# Patient Record
Sex: Female | Born: 1940 | Race: White | Hispanic: No | Smoking: Former smoker
Health system: Southern US, Community
[De-identification: ages and names within clinical notes are randomized; demographics above are authoritative.]

## PROBLEM LIST (undated history)

## (undated) DIAGNOSIS — R112 Nausea with vomiting, unspecified: Secondary | ICD-10-CM

## (undated) DIAGNOSIS — R0602 Shortness of breath: Secondary | ICD-10-CM

## (undated) DIAGNOSIS — I10 Essential (primary) hypertension: Secondary | ICD-10-CM

## (undated) DIAGNOSIS — G43909 Migraine, unspecified, not intractable, without status migrainosus: Secondary | ICD-10-CM

## (undated) DIAGNOSIS — M1990 Unspecified osteoarthritis, unspecified site: Secondary | ICD-10-CM

## (undated) DIAGNOSIS — Z9889 Other specified postprocedural states: Secondary | ICD-10-CM

## (undated) HISTORY — PX: DILATION AND CURETTAGE OF UTERUS: SHX78

## (undated) HISTORY — PX: JOINT REPLACEMENT: SHX530

---

## 2013-07-12 ENCOUNTER — Encounter (HOSPITAL_COMMUNITY): Payer: Self-pay | Admitting: Pharmacy Technician

## 2013-07-13 NOTE — Pre-Procedure Instructions (Signed)
Amelia JoDonna Bresnan  07/13/2013   Your procedure is scheduled on:  Tuesday, March 3rd  Report to Admitting at 0800 AM.  Call this number if you have problems the morning of surgery: [phone]   Remember:   Do not eat food or drink liquids after midnight.   Take these medicines the morning of surgery with A SIP OF WATER: none   Do not wear jewelry, make-up or nail polish.  Do not wear lotions, powders, or perfume, deodorant.  Do not shave 48 hours prior to surgery. Men may shave face and neck.  Do not bring valuables to the hospital.  [facility] is not responsible for any belongings or valuables.               Contacts, dentures or bridgework may not be worn into surgery.  Leave suitcase in the car. After surgery it may be brought to your room.  For patients admitted to the hospital, discharge time is determined by your    treatment team.          Please read over the following fact sheets that you were given: Pain Booklet, Coughing and Deep Breathing, Blood Transfusion Information, MRSA Information and Surgical Site Infection Prevention  [place] - Preparing for Surgery  Before surgery, you can play an important role.  Because skin is not sterile, your skin needs to be as free of germs as possible.  You can reduce the number of germs on you skin by washing with CHG (chlorahexidine gluconate) soap before surgery.  CHG is an antiseptic cleaner which kills germs and bonds with the skin to continue killing germs even after washing.  Please DO NOT use if you have an allergy to CHG or antibacterial soaps.  If your skin becomes reddened/irritated stop using the CHG and inform your nurse when you arrive at Short Stay.  Do not shave (including legs and underarms) for at least 48 hours prior to the first CHG shower.  You may shave your face.  Please follow these instructions carefully:   1.  Shower with CHG Soap the night before surgery and the morning of Surgery.  2.  If you choose to wash  your hair, wash your hair first as usual with your normal shampoo.  3.  After you shampoo, rinse your hair and body thoroughly to remove the shampoo.  4.  Use CHG as you would any other liquid soap.  You can apply CHG directly to the skin and wash gently with scrungie or a clean washcloth.  5.  Apply the CHG Soap to your body ONLY FROM THE NECK DOWN.  Do not use on open wounds or open sores.  Avoid contact with your eyes, ears, mouth and genitals (private parts).  Wash genitals (private parts) with your normal soap.  6.  Wash thoroughly, paying special attention to the area where your surgery will be performed.  7.  Thoroughly rinse your body with warm water from the neck down.  8.  DO NOT shower/wash with your normal soap after using and rinsing off the CHG Soap.  9.  Pat yourself dry with a clean towel.            10.  Wear clean pajamas.            11.  Place clean sheets on your bed the night of your first shower and do not sleep with pets.  Day of Surgery  Do not apply any lotions/deodorants the morning of surgery.  Please wear clean clothes to the hospital/surgery center.

## 2013-07-14 ENCOUNTER — Encounter (HOSPITAL_COMMUNITY)
Admission: RE | Admit: 2013-07-14 | Discharge: 2013-07-14 | Disposition: A | Discharge status: Home / Self Care | Payer: Medicare Other | Source: Ambulatory Visit | Attending: Anesthesiology | Admitting: Anesthesiology

## 2013-07-14 ENCOUNTER — Encounter (HOSPITAL_COMMUNITY)
Admission: RE | Admit: 2013-07-14 | Discharge: 2013-07-14 | Disposition: A | Discharge status: Home / Self Care | Payer: Medicare Other | Source: Ambulatory Visit | Attending: Orthopedic Surgery | Admitting: Orthopedic Surgery

## 2013-07-14 ENCOUNTER — Encounter (HOSPITAL_COMMUNITY): Payer: Self-pay

## 2013-07-14 DIAGNOSIS — Z01818 Encounter for other preprocedural examination: Secondary | ICD-10-CM | POA: Insufficient documentation

## 2013-07-14 DIAGNOSIS — Z01810 Encounter for preprocedural cardiovascular examination: Secondary | ICD-10-CM | POA: Insufficient documentation

## 2013-07-14 DIAGNOSIS — Z01812 Encounter for preprocedural laboratory examination: Secondary | ICD-10-CM | POA: Insufficient documentation

## 2013-07-14 HISTORY — DX: Essential (primary) hypertension: I10

## 2013-07-14 HISTORY — DX: Unspecified osteoarthritis, unspecified site: M19.90

## 2013-07-14 LAB — CBC
HEMATOCRIT: 40.9 % (ref 36.0–46.0)
Hemoglobin: 13.6 g/dL (ref 12.0–15.0)
MCH: 30.2 pg (ref 26.0–34.0)
MCHC: 33.3 g/dL (ref 30.0–36.0)
MCV: 90.7 fL (ref 78.0–100.0)
Platelets: 359 10*3/uL (ref 150–400)
RBC: 4.51 MIL/uL (ref 3.87–5.11)
RDW: 13 % (ref 11.5–15.5)
WBC: 7.9 10*3/uL (ref 4.0–10.5)

## 2013-07-14 LAB — TYPE AND SCREEN
ABO/RH(D): A POS
Antibody Screen: NEGATIVE

## 2013-07-14 LAB — URINE MICROSCOPIC-ADD ON

## 2013-07-14 LAB — SURGICAL PCR SCREEN
MRSA, PCR: NEGATIVE
STAPHYLOCOCCUS AUREUS: POSITIVE — AB

## 2013-07-14 LAB — URINALYSIS, ROUTINE W REFLEX MICROSCOPIC
Bilirubin Urine: NEGATIVE
Glucose, UA: NEGATIVE mg/dL
Ketones, ur: 15 mg/dL — AB
Nitrite: NEGATIVE
PROTEIN: NEGATIVE mg/dL
SPECIFIC GRAVITY, URINE: 1.03 (ref 1.005–1.030)
Urobilinogen, UA: 0.2 mg/dL (ref 0.0–1.0)
pH: 5.5 (ref 5.0–8.0)

## 2013-07-14 LAB — PROTIME-INR
INR: 1.04 (ref 0.00–1.49)
Prothrombin Time: 13.4 seconds (ref 11.6–15.2)

## 2013-07-14 LAB — BASIC METABOLIC PANEL
BUN: 19 mg/dL (ref 6–23)
CALCIUM: 9.7 mg/dL (ref 8.4–10.5)
CHLORIDE: 99 meq/L (ref 96–112)
CO2: 28 mEq/L (ref 19–32)
Creatinine, Ser: 0.89 mg/dL (ref 0.50–1.10)
GFR calc Af Amer: 74 mL/min — ABNORMAL LOW (ref >90)
GFR calc non Af Amer: 64 mL/min — ABNORMAL LOW (ref >90)
Glucose, Bld: 107 mg/dL — ABNORMAL HIGH (ref 70–99)
Potassium: 3.9 mEq/L (ref 3.7–5.3)
Sodium: 140 mEq/L (ref 137–147)

## 2013-07-14 LAB — ABO/RH: ABO/RH(D): A POS

## 2013-07-14 NOTE — Progress Notes (Signed)
Prescription called into [facility] in [place] for mupirocin 2% ointment.

## 2013-07-14 NOTE — Progress Notes (Signed)
Primary physician - dr. Clelia CroftShaw ([place]) Does not have a cardiologist No prior cardiac testing

## 2013-07-16 LAB — URINE CULTURE

## 2013-07-24 MED ORDER — ACETAMINOPHEN 500 MG PO TABS
1000.0000 mg | ORAL_TABLET | Freq: Once | ORAL | Status: AC
  Administered 2013-07-25: 1000 mg via ORAL
  Filled 2013-07-24: qty 2

## 2013-07-24 MED ORDER — CEFAZOLIN SODIUM-DEXTROSE 2-3 GM-% IV SOLR
2.0000 g | INTRAVENOUS | Status: AC
  Administered 2013-07-25: 2 g via INTRAVENOUS
  Filled 2013-07-24: qty 50

## 2013-07-25 ENCOUNTER — Encounter (HOSPITAL_COMMUNITY): Payer: Medicare Other | Admitting: Anesthesiology

## 2013-07-25 ENCOUNTER — Inpatient Hospital Stay (HOSPITAL_COMMUNITY): Payer: Medicare Other | Admitting: Anesthesiology

## 2013-07-25 ENCOUNTER — Encounter (HOSPITAL_COMMUNITY): Payer: Self-pay | Admitting: *Deleted

## 2013-07-25 ENCOUNTER — Inpatient Hospital Stay (HOSPITAL_COMMUNITY)
Admission: RE | Admit: 2013-07-25 | Discharge: 2013-07-28 | DRG: 470 | Disposition: A | Discharge status: Home Health | Payer: Medicare Other | Source: Ambulatory Visit | Attending: Orthopedic Surgery | Admitting: Orthopedic Surgery

## 2013-07-25 ENCOUNTER — Encounter (HOSPITAL_COMMUNITY)
Admission: RE | Disposition: A | Discharge status: Home Health | Payer: Self-pay | Source: Ambulatory Visit | Attending: Orthopedic Surgery

## 2013-07-25 DIAGNOSIS — I10 Essential (primary) hypertension: Secondary | ICD-10-CM | POA: Diagnosis present

## 2013-07-25 DIAGNOSIS — M1710 Unilateral primary osteoarthritis, unspecified knee: Principal | ICD-10-CM | POA: Diagnosis present

## 2013-07-25 HISTORY — PX: TOTAL KNEE ARTHROPLASTY: SHX125

## 2013-07-25 HISTORY — DX: Migraine, unspecified, not intractable, without status migrainosus: G43.909

## 2013-07-25 SURGERY — ARTHROPLASTY, KNEE, TOTAL
Anesthesia: Regional | Site: Knee | Laterality: Right

## 2013-07-25 MED ORDER — HYDROMORPHONE HCL PF 1 MG/ML IJ SOLN
INTRAMUSCULAR | Status: AC
  Filled 2013-07-25: qty 1

## 2013-07-25 MED ORDER — DEXTROSE-NACL 5-0.45 % IV SOLN: 100.0000 mL/h | INTRAVENOUS | Status: DC

## 2013-07-25 MED ORDER — MORPHINE SULFATE 2 MG/ML IJ SOLN: 2.0000 mg | INTRAMUSCULAR | Status: DC | PRN

## 2013-07-25 MED ORDER — BUPIVACAINE LIPOSOME 1.3 % IJ SUSP
INTRAMUSCULAR | Status: DC | PRN
  Administered 2013-07-25: 20 mL

## 2013-07-25 MED ORDER — HYDROMORPHONE BOLUS VIA INFUSION
0.5000 mg | Freq: Once | INTRAVENOUS | Status: AC
  Administered 2013-07-25 (×2): 0.5 mg via INTRAVENOUS
  Filled 2013-07-25: qty 1

## 2013-07-25 MED ORDER — 0.9 % SODIUM CHLORIDE (POUR BTL) OPTIME
TOPICAL | Status: DC | PRN
  Administered 2013-07-25: 1000 mL

## 2013-07-25 MED ORDER — ARTIFICIAL TEARS OP OINT
TOPICAL_OINTMENT | OPHTHALMIC | Status: DC | PRN
  Administered 2013-07-25: 1 via OPHTHALMIC

## 2013-07-25 MED ORDER — DEXAMETHASONE 6 MG PO TABS
10.0000 mg | ORAL_TABLET | Freq: Three times a day (TID) | ORAL | Status: AC
  Administered 2013-07-25 – 2013-07-26 (×2): 10 mg via ORAL
  Filled 2013-07-25 (×3): qty 1

## 2013-07-25 MED ORDER — LACTATED RINGERS IV SOLN
INTRAVENOUS | Status: DC | PRN
  Administered 2013-07-25 (×2): via INTRAVENOUS

## 2013-07-25 MED ORDER — ACETAMINOPHEN 650 MG RE SUPP: 650.0000 mg | Freq: Four times a day (QID) | RECTAL | Status: DC | PRN

## 2013-07-25 MED ORDER — PHENOL 1.4 % MT LIQD: 1.0000 | OROMUCOSAL | Status: DC | PRN

## 2013-07-25 MED ORDER — ONDANSETRON HCL 4 MG/2ML IJ SOLN
INTRAMUSCULAR | Status: DC | PRN
  Administered 2013-07-25: 4 mg via INTRAVENOUS

## 2013-07-25 MED ORDER — CHLORHEXIDINE GLUCONATE CLOTH 2 % EX PADS
6.0000 | MEDICATED_PAD | Freq: Once | CUTANEOUS | Status: DC
Start: 2013-07-25 — End: 2013-07-25

## 2013-07-25 MED ORDER — LISINOPRIL 10 MG PO TABS
10.0000 mg | ORAL_TABLET | Freq: Every day | ORAL | Status: DC
  Administered 2013-07-25 – 2013-07-28 (×4): 10 mg via ORAL
  Filled 2013-07-25 (×4): qty 1

## 2013-07-25 MED ORDER — SODIUM CHLORIDE 0.9 % IR SOLN
Status: DC | PRN
  Administered 2013-07-25: 3000 mL

## 2013-07-25 MED ORDER — ACETAMINOPHEN 325 MG PO TABS
650.0000 mg | ORAL_TABLET | Freq: Four times a day (QID) | ORAL | Status: DC | PRN
  Administered 2013-07-25 – 2013-07-28 (×3): 650 mg via ORAL
  Filled 2013-07-25 (×3): qty 2

## 2013-07-25 MED ORDER — MIDAZOLAM HCL 2 MG/2ML IJ SOLN
INTRAMUSCULAR | Status: AC
  Administered 2013-07-25: 2 mg
  Filled 2013-07-25: qty 2

## 2013-07-25 MED ORDER — LIDOCAINE HCL (CARDIAC) 20 MG/ML IV SOLN
INTRAVENOUS | Status: AC
  Filled 2013-07-25: qty 5

## 2013-07-25 MED ORDER — OXYCODONE HCL 5 MG PO TABS
5.0000 mg | ORAL_TABLET | ORAL | Status: DC | PRN
  Administered 2013-07-26 (×2): 5 mg via ORAL
  Administered 2013-07-27 (×2): 10 mg via ORAL
  Administered 2013-07-27 (×2): 5 mg via ORAL
  Administered 2013-07-28 (×2): 10 mg via ORAL
  Filled 2013-07-25: qty 1
  Filled 2013-07-25: qty 2
  Filled 2013-07-25 (×2): qty 1
  Filled 2013-07-25: qty 2
  Filled 2013-07-25: qty 1
  Filled 2013-07-25 (×2): qty 2

## 2013-07-25 MED ORDER — FENTANYL CITRATE 0.05 MG/ML IJ SOLN
INTRAMUSCULAR | Status: AC
  Filled 2013-07-25: qty 5

## 2013-07-25 MED ORDER — MIDAZOLAM HCL 2 MG/2ML IJ SOLN
INTRAMUSCULAR | Status: AC
  Filled 2013-07-25: qty 2

## 2013-07-25 MED ORDER — SODIUM CHLORIDE 0.9 % IJ SOLN
INTRAMUSCULAR | Status: DC | PRN
  Administered 2013-07-25: 40 mL

## 2013-07-25 MED ORDER — GLYCOPYRROLATE 0.2 MG/ML IJ SOLN
INTRAMUSCULAR | Status: DC | PRN
  Administered 2013-07-25: 0.4 mg via INTRAVENOUS

## 2013-07-25 MED ORDER — DEXAMETHASONE SODIUM PHOSPHATE 10 MG/ML IJ SOLN
10.0000 mg | Freq: Three times a day (TID) | INTRAMUSCULAR | Status: AC
  Administered 2013-07-25: 10 mg via INTRAVENOUS
  Filled 2013-07-25 (×4): qty 1

## 2013-07-25 MED ORDER — ROCURONIUM BROMIDE 50 MG/5ML IV SOLN
INTRAVENOUS | Status: AC
  Filled 2013-07-25: qty 1

## 2013-07-25 MED ORDER — ONDANSETRON HCL 4 MG/2ML IJ SOLN: 4.0000 mg | Freq: Four times a day (QID) | INTRAMUSCULAR | Status: DC | PRN

## 2013-07-25 MED ORDER — OXYCODONE HCL 5 MG/5ML PO SOLN: 5.0000 mg | Freq: Once | ORAL | Status: DC | PRN

## 2013-07-25 MED ORDER — DEXAMETHASONE SODIUM PHOSPHATE 10 MG/ML IJ SOLN
INTRAMUSCULAR | Status: AC
  Filled 2013-07-25: qty 1

## 2013-07-25 MED ORDER — NEOSTIGMINE METHYLSULFATE 1 MG/ML IJ SOLN
INTRAMUSCULAR | Status: DC | PRN
  Administered 2013-07-25: 3 mg via INTRAVENOUS

## 2013-07-25 MED ORDER — LIDOCAINE HCL (CARDIAC) 20 MG/ML IV SOLN
INTRAVENOUS | Status: DC | PRN
  Administered 2013-07-25: 40 mg via INTRAVENOUS

## 2013-07-25 MED ORDER — PROPOFOL 10 MG/ML IV BOLUS
INTRAVENOUS | Status: DC | PRN
  Administered 2013-07-25: 150 mg via INTRAVENOUS

## 2013-07-25 MED ORDER — ROCURONIUM BROMIDE 100 MG/10ML IV SOLN
INTRAVENOUS | Status: DC | PRN
  Administered 2013-07-25: 40 mg via INTRAVENOUS

## 2013-07-25 MED ORDER — CHLORHEXIDINE GLUCONATE CLOTH 2 % EX PADS: 6.0000 | MEDICATED_PAD | Freq: Once | CUTANEOUS | Status: DC

## 2013-07-25 MED ORDER — PROPOFOL 10 MG/ML IV BOLUS
INTRAVENOUS | Status: AC
  Filled 2013-07-25: qty 20

## 2013-07-25 MED ORDER — LACTATED RINGERS IV SOLN
INTRAVENOUS | Status: DC
  Administered 2013-07-25: 09:00:00 via INTRAVENOUS

## 2013-07-25 MED ORDER — OXYCODONE HCL 5 MG PO TABS: 5.0000 mg | ORAL_TABLET | Freq: Once | ORAL | Status: DC | PRN

## 2013-07-25 MED ORDER — CEFAZOLIN SODIUM-DEXTROSE 2-3 GM-% IV SOLR
2.0000 g | Freq: Four times a day (QID) | INTRAVENOUS | Status: AC
  Administered 2013-07-25 (×2): 2 g via INTRAVENOUS
  Filled 2013-07-25 (×2): qty 50

## 2013-07-25 MED ORDER — METOCLOPRAMIDE HCL 10 MG PO TABS: 5.0000 mg | ORAL_TABLET | Freq: Three times a day (TID) | ORAL | Status: DC | PRN

## 2013-07-25 MED ORDER — FENTANYL CITRATE 0.05 MG/ML IJ SOLN
INTRAMUSCULAR | Status: AC
  Administered 2013-07-25: 100 ug
  Filled 2013-07-25: qty 2

## 2013-07-25 MED ORDER — ONDANSETRON HCL 4 MG PO TABS: 4.0000 mg | ORAL_TABLET | Freq: Four times a day (QID) | ORAL | Status: DC | PRN

## 2013-07-25 MED ORDER — CHLORHEXIDINE GLUCONATE 4 % EX LIQD
60.0000 mL | Freq: Once | CUTANEOUS | Status: DC
Start: 2013-07-25 — End: 2013-07-25
  Filled 2013-07-25: qty 60

## 2013-07-25 MED ORDER — BUPIVACAINE LIPOSOME 1.3 % IJ SUSP
20.0000 mL | INTRAMUSCULAR | Status: DC
  Filled 2013-07-25: qty 20

## 2013-07-25 MED ORDER — ASPIRIN EC 325 MG PO TBEC
325.0000 mg | DELAYED_RELEASE_TABLET | Freq: Every day | ORAL | Status: DC
  Administered 2013-07-26 – 2013-07-28 (×3): 325 mg via ORAL
  Filled 2013-07-25 (×4): qty 1

## 2013-07-25 MED ORDER — METOCLOPRAMIDE HCL 5 MG/ML IJ SOLN: 5.0000 mg | Freq: Three times a day (TID) | INTRAMUSCULAR | Status: DC | PRN

## 2013-07-25 MED ORDER — DEXTROSE-NACL 5-0.45 % IV SOLN: INTRAVENOUS | Status: AC

## 2013-07-25 MED ORDER — MENTHOL 3 MG MT LOZG
1.0000 | LOZENGE | OROMUCOSAL | Status: DC | PRN
  Filled 2013-07-25: qty 9

## 2013-07-25 MED ORDER — DOCUSATE SODIUM 100 MG PO CAPS
100.0000 mg | ORAL_CAPSULE | Freq: Two times a day (BID) | ORAL | Status: DC
  Administered 2013-07-25 – 2013-07-28 (×5): 100 mg via ORAL
  Filled 2013-07-25 (×7): qty 1

## 2013-07-25 MED ORDER — FENTANYL CITRATE 0.05 MG/ML IJ SOLN
INTRAMUSCULAR | Status: DC | PRN
  Administered 2013-07-25 (×4): 50 ug via INTRAVENOUS
  Administered 2013-07-25: 100 ug via INTRAVENOUS

## 2013-07-25 MED ORDER — HYDROMORPHONE HCL PF 1 MG/ML IJ SOLN
0.2500 mg | INTRAMUSCULAR | Status: DC | PRN
Start: 2013-07-25 — End: 2013-07-25

## 2013-07-25 MED ORDER — ARTIFICIAL TEARS OP OINT
TOPICAL_OINTMENT | OPHTHALMIC | Status: AC
  Filled 2013-07-25: qty 3.5

## 2013-07-25 MED ORDER — ONDANSETRON HCL 4 MG/2ML IJ SOLN
INTRAMUSCULAR | Status: AC
  Filled 2013-07-25: qty 2

## 2013-07-25 MED ORDER — PHENYLEPHRINE HCL 10 MG/ML IJ SOLN
INTRAMUSCULAR | Status: DC | PRN
  Administered 2013-07-25: 120 ug via INTRAVENOUS

## 2013-07-25 SURGICAL SUPPLY — 65 items
BANDAGE ESMARK 6X9 LF (GAUZE/BANDAGES/DRESSINGS) ×1 IMPLANT
BLADE SAG 18X100X1.27 (BLADE) ×4 IMPLANT
BLADE SURG 10 STRL SS (BLADE) ×4 IMPLANT
BNDG ELASTIC 6X15 VLCR STRL LF (GAUZE/BANDAGES/DRESSINGS) ×2 IMPLANT
BNDG ESMARK 6X9 LF (GAUZE/BANDAGES/DRESSINGS) ×2
BOWL SMART MIX CTS (DISPOSABLE) ×2 IMPLANT
CEMENT BONE SIMPLEX SPEEDSET (Cement) ×4 IMPLANT
COVER SURGICAL LIGHT HANDLE (MISCELLANEOUS) ×2 IMPLANT
CUFF TOURNIQUET SINGLE 34IN LL (TOURNIQUET CUFF) ×2 IMPLANT
DRAPE EXTREMITY T 121X128X90 (DRAPE) ×2 IMPLANT
DRAPE PROXIMA HALF (DRAPES) ×2 IMPLANT
DRAPE U-SHAPE 47X51 STRL (DRAPES) ×2 IMPLANT
DRSG ADAPTIC 3X8 NADH LF (GAUZE/BANDAGES/DRESSINGS) ×2 IMPLANT
DURAPREP 26ML APPLICATOR (WOUND CARE) ×4 IMPLANT
ELECT CAUTERY BLADE 6.4 (BLADE) ×2 IMPLANT
ELECT REM PT RETURN 9FT ADLT (ELECTROSURGICAL) ×2
ELECTRODE REM PT RTRN 9FT ADLT (ELECTROSURGICAL) ×1 IMPLANT
EVACUATOR 1/8 PVC DRAIN (DRAIN) IMPLANT
FACESHIELD LNG OPTICON STERILE (SAFETY) ×4 IMPLANT
GLOVE BIO SURGEON STRL SZ7 (GLOVE) ×2 IMPLANT
GLOVE BIO SURGEON STRL SZ7.5 (GLOVE) ×2 IMPLANT
GLOVE BIOGEL PI IND STRL 6 (GLOVE) ×1 IMPLANT
GLOVE BIOGEL PI IND STRL 7.0 (GLOVE) ×2 IMPLANT
GLOVE BIOGEL PI IND STRL 8 (GLOVE) ×1 IMPLANT
GLOVE BIOGEL PI INDICATOR 6 (GLOVE) ×1
GLOVE BIOGEL PI INDICATOR 7.0 (GLOVE) ×2
GLOVE BIOGEL PI INDICATOR 8 (GLOVE) ×1
GOWN STRL REUS W/ TWL LRG LVL3 (GOWN DISPOSABLE) ×2 IMPLANT
GOWN STRL REUS W/ TWL XL LVL3 (GOWN DISPOSABLE) ×2 IMPLANT
GOWN STRL REUS W/TWL LRG LVL3 (GOWN DISPOSABLE) ×2
GOWN STRL REUS W/TWL XL LVL3 (GOWN DISPOSABLE) ×2
HANDPIECE INTERPULSE COAX TIP (DISPOSABLE) ×1
IMMOBILIZER KNEE 22 UNIV (SOFTGOODS) ×2 IMPLANT
IMMOBILIZER KNEE 24 THIGH 36 (MISCELLANEOUS) IMPLANT
IMMOBILIZER KNEE 24 UNIV (MISCELLANEOUS)
KIT BASIN OR (CUSTOM PROCEDURE TRAY) ×2 IMPLANT
KIT ROOM TURNOVER OR (KITS) ×2 IMPLANT
KNEE/VIT E POLY LINER LEVEL 1B ×2 IMPLANT
MANIFOLD NEPTUNE II (INSTRUMENTS) ×2 IMPLANT
NEEDLE 18GX1X1/2 (RX/OR ONLY) (NEEDLE) ×2 IMPLANT
NEEDLE HYPO 25GX1X1/2 BEV (NEEDLE) IMPLANT
NS IRRIG 1000ML POUR BTL (IV SOLUTION) ×2 IMPLANT
PACK TOTAL JOINT (CUSTOM PROCEDURE TRAY) ×2 IMPLANT
PAD ABD 8X10 STRL (GAUZE/BANDAGES/DRESSINGS) ×4 IMPLANT
PAD ARMBOARD 7.5X6 YLW CONV (MISCELLANEOUS) ×4 IMPLANT
PAD CAST 4YDX4 CTTN HI CHSV (CAST SUPPLIES) ×1 IMPLANT
PADDING CAST COTTON 4X4 STRL (CAST SUPPLIES) ×1
PADDING CAST COTTON 6X4 STRL (CAST SUPPLIES) ×2 IMPLANT
SET HNDPC FAN SPRY TIP SCT (DISPOSABLE) ×1 IMPLANT
SPONGE GAUZE 4X4 12PLY (GAUZE/BANDAGES/DRESSINGS) ×2 IMPLANT
STAPLER VISISTAT 35W (STAPLE) IMPLANT
STRIP CLOSURE SKIN 1/2X4 (GAUZE/BANDAGES/DRESSINGS) ×4 IMPLANT
SUCTION FRAZIER TIP 10 FR DISP (SUCTIONS) ×2 IMPLANT
SUT MNCRL AB 4-0 PS2 18 (SUTURE) ×2 IMPLANT
SUT MON AB 2-0 CT1 27 (SUTURE) ×4 IMPLANT
SUT MON AB 2-0 CT1 36 (SUTURE) ×2 IMPLANT
SUT VIC AB 0 CT1 27 (SUTURE) ×1
SUT VIC AB 0 CT1 27XBRD ANBCTR (SUTURE) ×1 IMPLANT
SUT VIC AB 1 CTX 36 (SUTURE) ×2
SUT VIC AB 1 CTX36XBRD ANBCTR (SUTURE) ×2 IMPLANT
SYR 50ML LL SCALE MARK (SYRINGE) ×2 IMPLANT
SYR CONTROL 10ML LL (SYRINGE) IMPLANT
TOWEL OR 17X24 6PK STRL BLUE (TOWEL DISPOSABLE) ×2 IMPLANT
TOWEL OR 17X26 10 PK STRL BLUE (TOWEL DISPOSABLE) ×2 IMPLANT
WATER STERILE IRR 1000ML POUR (IV SOLUTION) IMPLANT

## 2013-07-25 NOTE — Interval H&P Note (Signed)
History and Physical Interval Note:  07/25/2013 7:30 AM  [name]  has presented today for surgery, with the diagnosis of OA RIGHT KNEE  The various methods of treatment have been discussed with the patient and family. After consideration of risks, benefits and other options for treatment, the patient has consented to  Procedure(s): RIGHT TOTAL KNEE ARTHROPLASTY (Right) as a surgical intervention .  The patient's history has been reviewed, patient examined, no change in status, stable for surgery.  I have reviewed the patient's chart and labs.  Questions were answered to the patient's satisfaction.     Robin Petrakis, D

## 2013-07-25 NOTE — Progress Notes (Signed)
Utilization review completed.  

## 2013-07-25 NOTE — Plan of Care (Signed)
Problem: Consults Goal: Diagnosis- Total Joint Replacement Primary Total Knee Right     

## 2013-07-25 NOTE — Preoperative (Signed)
Beta Blockers   Reason not to administer Beta Blockers:Not Applicable 

## 2013-07-25 NOTE — Anesthesia Procedure Notes (Signed)
Procedure Name: Intubation Date/Time: 07/25/2013 11:07 AM Performed by: Tyrone NineSAUVE, Keng Jewel F Pre-anesthesia Checklist: Patient identified, Timeout performed, Emergency Drugs available, Suction available and Patient being monitored Patient Re-evaluated:Patient Re-evaluated prior to inductionOxygen Delivery Method: Circle system utilized Preoxygenation: Pre-oxygenation with 100% oxygen Intubation Type: IV induction Ventilation: Oral airway inserted - appropriate to patient size and Mask ventilation without difficulty Laryngoscope Size: Mac and 3 Grade View: Grade II Tube type: Oral Tube size: 7.5 mm Number of attempts: 1 Airway Equipment and Method: Stylet Placement Confirmation: ETT inserted through vocal cords under direct vision and breath sounds checked- equal and bilateral Secured at: 20 cm Tube secured with: Tape Dental Injury: Teeth and Oropharynx as per pre-operative assessment

## 2013-07-25 NOTE — Anesthesia Postprocedure Evaluation (Signed)
Anesthesia Post Note  Patient: [name]  Procedure(s) Performed: Procedure(s) (LRB): RIGHT TOTAL KNEE ARTHROPLASTY (Right)  Anesthesia type: General  Patient location: PACU  Post pain: Pain level controlled and Adequate analgesia  Post assessment: Post-op Vital signs reviewed, Patient's Cardiovascular Status Stable, Respiratory Function Stable, Patent Airway and Pain level controlled  Last Vitals:  Filed Vitals:   07/25/13 1352  BP: 133/75  Pulse: 100  Temp:   Resp: 11    Post vital signs: Reviewed and stable  Level of consciousness: awake, alert  and oriented  Complications: No apparent anesthesia complications

## 2013-07-25 NOTE — Op Note (Signed)
DATE OF SURGERY:  07/25/2013 TIME: 1:36 PM  PATIENT NAME:  [name]   AGE: 73 y.o.    PRE-OPERATIVE DIAGNOSIS:  OSTEOARTHRITIS RIGHT KNEE  POST-OPERATIVE DIAGNOSIS:  Same  PROCEDURE:  Procedure(s): RIGHT TOTAL KNEE ARTHROPLASTY   SURGEON:  Sheral ApleyMURPHY, Chelsae Zanella, D, MD   ASSISTANT:  Odelia GageLindsey Anton PA   OPERATIVE IMPLANTS: Stryker Triathlon Posterior Stabilized.  Femur size 3, Tibia size 3, Patella size 29 3-peg oval button, with a 9 mm polyethylene insert.   PREOPERATIVE INDICATIONS:  [name] is a 73 y.o. year old female with end stage bone on bone degenerative arthritis of the knee who failed conservative treatment, including injections, antiinflammatories, activity modification, and assistive devices, and had significant impairment of their activities of daily living, and elected for Total Knee Arthroplasty.   The risks, benefits, and alternatives were discussed at length including but not limited to the risks of infection, bleeding, nerve injury, stiffness, blood clots, the need for revision surgery, cardiopulmonary complications, among others, and they were willing to proceed.   OPERATIVE DESCRIPTION:  The patient was brought to the operative room and placed in a supine position.  General anesthesia was administered.  IV antibiotics were given.  The lower extremity was prepped and draped in the usual sterile fashion.  Time out was performed.  The leg was elevated and exsanguinated and the tourniquet was inflated.  Anterior approach was performed.  The patella was everted and osteophytes were removed.  The anterior horn of the medial and lateral meniscus was removed.   The distal femur was opened with the drill and the intramedullary distal femoral cutting jig was utilized, set at 5 degrees resecting 10 mm off the distal femur.  Care was taken to protect the collateral ligaments.  The distal femoral sizing jig was applied, taking care to avoid notching.  Then the 4-in-1 cutting  jig was applied and the anterior and posterior femur was cut, along with the chamfer cuts.  All posterior osteophytes were removed.  The flexion gap was then measured and was symmetric with the extension gap.  Then the extramedullary tibial cutting jig was utilized making the appropriate cut using the anterior tibial crest as a reference building in appropriate posterior slope.  Care was taken during the cut to protect the medial and collateral ligaments.  The proximal tibia was removed along with the posterior horns of the menisci.  The PCL was sacrificed.    The extensor gap was measured and was approximately 9mm.    I completed the distal femoral preparation using the appropriate jig to prepare the box.  The patella was then measured, and cut with the saw.    The proximal tibia sized and prepared accordingly with the reamer and the punch, and then all components were trialed with the 9mm poly insert.  The knee was found to have excellent balance and full motion.    The above named components were then cemented into place and all excess cement was removed.  The real polyethylene implant was placed.  Exparel was injected in the soft tissue  The knee was easily taken through a range of motion and the patella tracked well and the knee irrigated copiously and the parapatellar and subcutaneous tissue closed with vicryl, and monocryl with steri strips for the skin.  The wounds were injected with exparel, and dressed with sterile gauze and the tourniquet released and the patient was awakened and returned to the PACU in stable and satisfactory condition.  There were no  complications.  Total tourniquet time was 50 minutes.   POSTOPERATIVE PLAN: post op Abx, DVT px: ASA 325 daily

## 2013-07-25 NOTE — Transfer of Care (Signed)
Immediate Anesthesia Transfer of Care Note  Patient: [name]  Procedure(s) Performed: Procedure(s): RIGHT TOTAL KNEE ARTHROPLASTY (Right)  Patient Location: PACU  Anesthesia Type:General  Level of Consciousness: sedated and patient cooperative  Airway & Oxygen Therapy: Patient Spontanous Breathing and Patient connected to face mask oxygen  Post-op Assessment: Report given to PACU RN and Post -op Vital signs reviewed and stable  Post vital signs: Reviewed and stable  Complications: No apparent anesthesia complications

## 2013-07-25 NOTE — H&P (Signed)
      ORTHOPAEDIC CONSULTATION  REQUESTING PHYSICIAN: Renette Butters, MD  Chief Complaint: R knee endstage DJD  HPI: [name] is a 73 y.o. female who complains of  pain  Past Medical History  Diagnosis Date  . Hypertension   . Arthritis    Past Surgical History  Procedure Laterality Date  . Dilation and curettage of uterus     History   Social History  . Marital Status: Widowed    Spouse Name: N/A    Number of Children: N/A  . Years of Education: N/A   Social History Main Topics  . Smoking status: Never Smoker   . Smokeless tobacco: Not on file  . Alcohol Use: No  . Drug Use: No  . Sexual Activity: Not on file   Other Topics Concern  . Not on file   Social History Narrative  . No narrative on file   No family history on file. No Known Allergies Prior to Admission medications   Medication Sig Start Date End Date Taking? Authorizing Provider  lisinopril (PRINIVIL,ZESTRIL) 10 MG tablet Take 10 mg by mouth daily.   Yes Historical Provider, MD  meloxicam (MOBIC) 15 MG tablet Take 15 mg by mouth daily.   Yes Historical Provider, MD  Multiple Vitamin (MULTIVITAMIN WITH MINERALS) TABS tablet Take 1 tablet by mouth daily.   Yes Historical Provider, MD  naproxen sodium (ANAPROX) 220 MG tablet Take 220 mg by mouth 2 (two) times daily with a meal.    Historical Provider, MD   No results found.  Positive ROS: All other systems have been reviewed and were otherwise negative with the exception of those mentioned in the HPI and as above.  Labs cbc No results found for this basename: WBC, HGB, HCT, PLT,  in the last 72 hours  Labs inflam No results found for this basename: ESR, CRP,  in the last 72 hours  Labs coag No results found for this basename: INR, PT, PTT,  in the last 72 hours  No results found for this basename: NA, K, CL, CO2, GLUCOSE, BUN, CREATININE, CALCIUM,  in the last 72 hours  Physical Exam: There were no vitals filed for this visit. General:  Alert, no acute distress Cardiovascular: No pedal edema Respiratory: No cyanosis, no use of accessory musculature GI: No organomegaly, abdomen is soft and non-tender Skin: No lesions in the area of chief complaint Neurologic: Sensation intact distally Psychiatric: Patient is competent for consent with normal mood and affect Lymphatic: No axillary or cervical lymphadenopathy  MUSCULOSKELETAL:  Pain with ROM Other extremities are atraumatic with painless ROM and NVI.  Assessment: R TKA     Edmonia Lynch, D, MD Cell [phone]   07/25/2013 7:30 AM

## 2013-07-25 NOTE — Progress Notes (Signed)
Orthopedic Tech Progress Note Patient Details:  [name] 11-27-1940 [id number]  CPM Right Knee CPM Right Knee: On Right Knee Flexion (Degrees): 60 Right Knee Extension (Degrees): 0 Additional Comments: Trapeze bar and foot roll   [name], [name] 07/25/2013, 2:39 PM

## 2013-07-25 NOTE — Anesthesia Preprocedure Evaluation (Addendum)
Anesthesia Evaluation  Patient identified by MRN, date of birth, ID band Patient awake    Reviewed: Allergy & Precautions, H&P , NPO status , Patient's Chart, lab work & pertinent test results  Airway Mallampati: II TM Distance: >3 FB     Dental  (+) Edentulous Upper, Partial Lower, Dental Advisory Given   Pulmonary neg pulmonary ROS,  07-14-13 Chest x-ray FINDINGS: Normal heart size, mediastinal contours, and pulmonary vascularity. Lungs clear.  No pleural effusion or pneumothorax. Bones unremarkable. IMPRESSION: Normal exam   Pulmonary exam normal       Cardiovascular Exercise Tolerance: Good hypertension, Pt. on medications Rhythm:Regular Rate:Normal  14-Jul-2013  Normal sinus rhythm Nonspecific ST abnormality   Neuro/Psych negative neurological ROS     GI/Hepatic negative GI ROS, Neg liver ROS,   Endo/Other  negative endocrine ROS  Renal/GU negative Renal ROS     Musculoskeletal  (+) Arthritis -, Osteoarthritis,    Abdominal Normal abdominal exam  (+)   Peds  Hematology negative hematology ROS (+)   Anesthesia Other Findings   Reproductive/Obstetrics negative OB ROS                      Anesthesia Physical Anesthesia Plan  ASA: III  Anesthesia Plan: General   Post-op Pain Management:    Induction: Intravenous  Airway Management Planned: Oral ETT  Additional Equipment:   Intra-op Plan:   Post-operative Plan: Extubation in OR  Informed Consent:   Plan Discussed with:   Anesthesia Plan Comments:         Anesthesia Quick Evaluation

## 2013-07-26 ENCOUNTER — Inpatient Hospital Stay (HOSPITAL_COMMUNITY): Payer: Medicare Other

## 2013-07-26 ENCOUNTER — Encounter (HOSPITAL_COMMUNITY): Payer: Self-pay | Admitting: General Practice

## 2013-07-26 LAB — CBC
HEMATOCRIT: 35.8 % — AB (ref 36.0–46.0)
Hemoglobin: 11.7 g/dL — ABNORMAL LOW (ref 12.0–15.0)
MCH: 29.7 pg (ref 26.0–34.0)
MCHC: 32.7 g/dL (ref 30.0–36.0)
MCV: 90.9 fL (ref 78.0–100.0)
PLATELETS: 307 10*3/uL (ref 150–400)
RBC: 3.94 MIL/uL (ref 3.87–5.11)
RDW: 13 % (ref 11.5–15.5)
WBC: 14 10*3/uL — AB (ref 4.0–10.5)

## 2013-07-26 NOTE — Evaluation (Signed)
Occupational Therapy Evaluation Patient Details Name: [name] MRN: [id number] DOB: December 08, 1940 Today's Date: 07/26/2013 Time: 4098-1191 OT Time Calculation (min): 25 min  OT Assessment / Plan / Recommendation History of present illness s/p R TKA   Clinical Impression   Pt presents with below problem list. Pt PLOF is independent. Pt would benefit from acute OT to increase independence prior to d/c.    OT Assessment  Patient needs continued OT Services    Follow Up Recommendations  No OT follow up;Supervision - Intermittent    Barriers to Discharge      Equipment Recommendations  None recommended by OT    Recommendations for Other Services    Frequency  Min 2X/week    Precautions / Restrictions Precautions Precautions: Knee Required Braces or Orthoses: Knee Immobilizer - Right Knee Immobilizer - Right: Other (comment) (No order, KI in room so OT donned for OOB) Restrictions Weight Bearing Restrictions: Yes RLE Weight Bearing: Weight bearing as tolerated   Pertinent Vitals/Pain No pain.    ADL  Grooming: Teeth care;Wash/dry hands;Supervision/safety Where Assessed - Grooming: Supported standing Lower Body Dressing: Supervision/safety Where Assessed - Lower Body Dressing: Supported sit to Pharmacist, hospital: Radiographer, therapeutic Method: Sit to Barista: Raised toilet seat with arms (or 3-in-1 over toilet) Toileting - Clothing Manipulation and Hygiene: Supervision/safety Where Assessed - Engineer, mining and Hygiene: Standing Tub/Shower Transfer Method: Not assessed Equipment Used: Gait belt;Knee Immobilizer;Rolling walker;Sock aid;Long-handled sponge;Long-handled shoe horn Transfers/Ambulation Related to ADLs: Supervision  ADL Comments: pt practiced with sock aid; vc for donning KI before standing. Pt completed grooming task in supported standing at sink with good toleration. Educated on dressing technique, safety  with ADLs.   OT Diagnosis: Acute pain  OT Problem List: Decreased safety awareness;Decreased knowledge of use of DME or AE;Decreased range of motion;Decreased knowledge of precautions OT Treatment Interventions: Self-care/ADL training;Therapeutic exercise;DME and/or AE instruction;Therapeutic activities;Patient/family education   OT Goals(Current goals can be found in the care plan section) Acute Rehab OT Goals Patient Stated Goal: not stated OT Goal Formulation: With patient Time For Goal Achievement: 08/02/13 Potential to Achieve Goals: Good ADL Goals Pt Will Perform Grooming: with supervision;standing Pt Will Perform Lower Body Bathing: with modified independence;sit to/from stand Pt Will Perform Lower Body Dressing: with modified independence;sit to/from stand Pt Will Perform Tub/Shower Transfer: Shower transfer;with supervision;ambulating;3 in 1  Visit Information  Last OT Received On: 07/26/13 Assistance Needed: +1 History of Present Illness: s/p R TKA       Prior Functioning     Home Living Family/patient expects to be discharged to:: Private residence Living Arrangements: Alone Available Help at Discharge: Family;Available 24 hours/day Type of Home: House Home Access: Stairs to enter [employer] of Steps: 4 Entrance Stairs-Rails: Right;Left Home Layout: One level Home Equipment: Walker - 2 wheels;Toilet riser;Shower seat;Cane - single point;Adaptive equipment Adaptive Equipment: Sock aid Prior Function Level of Independence: Independent Communication Communication: No difficulties         Vision/Perception     Cognition  Cognition Arousal/Alertness: Awake/alert Behavior During Therapy: WFL for tasks assessed/performed Overall Cognitive Status: Within Functional Limits for tasks assessed    Extremity/Trunk Assessment Upper Extremity Assessment Upper Extremity Assessment: Overall WFL for tasks assessed Lower Extremity Assessment Lower  Extremity Assessment: Defer to PT evaluation RLE Deficits / Details: ROM decreased by pain, strength grossly 3-/5 Cervical / Trunk Assessment Cervical / Trunk Assessment: Normal     Mobility Bed Mobility Overal bed mobility: Modified Independent General bed mobility  comments: no rail used after vc to try EOB to supine without using rails Transfers Overall transfer level: Needs assistance Equipment used: Rolling walker (2 wheeled) Transfers: Sit to/from Stand Sit to Stand: Supervision General transfer comment:  vc for safety needed when pt initiated reaching down to pick up item from floor while ambulating.      Exercise     Balance     End of Session OT - End of Session Equipment Utilized During Treatment: Gait belt;Rolling walker;Right knee immobilizer Activity Tolerance: Patient tolerated treatment well Patient left: in bed;with call bell/phone within reach CPM Right Knee CPM Right Knee: Off  GO     Pilar GrammesMathews, Mandalyn Pasqua H OTR/L 07/26/2013, 12:16 PM

## 2013-07-26 NOTE — Progress Notes (Signed)
I participated in the care of this patient and agree with the above history, physical and evaluation. I performed a review of the history and a physical exam as detailed   Tab Rylee Daniel Martine Bleecker MD  

## 2013-07-26 NOTE — Progress Notes (Signed)
Patient ID: Amelia JoDonna Grecco, female   DOB: Mar 13, 1941, 73 y.o.   MRN: [id number]     Subjective:  Patient reports pain as mild.  Patient sitting in the chair in no acute distress.  She denies any CP orSOB.  Objective:   VITALS:   Filed Vitals:   07/25/13 1452 07/25/13 1510 07/25/13 2048 07/26/13 0540  BP: 129/79 146/75 137/67 133/62  Pulse: 97 98 104 99  Temp: 98.4 F (36.9 C) 98.6 F (37 C) 98.3 F (36.8 C) 98 F (36.7 C)  TempSrc:      Resp: 9 12 16 16   SpO2: 98% 96% 95% 95%    ABD soft Sensation intact distally Dorsiflexion/Plantar flexion intact Incision: dressing C/D/I and no drainage   Lab Results  Component Value Date   WBC 14.0* 07/26/2013   HGB 11.7* 07/26/2013   HCT 35.8* 07/26/2013   MCV 90.9 07/26/2013   PLT 307 07/26/2013     Assessment/Plan: 1 Day Post-Op   Active Problems:   Arthritis of knee   Advance diet Up with therapy WBAT Dry dressing PRN Plan for DC home on Friday   DOUGLAS Janace LittenRRY, Maicol Bowland 07/26/2013, 8:54 AM   Margarita Ranaimothy Murphy MD [phone]

## 2013-07-26 NOTE — Evaluation (Signed)
Physical Therapy Evaluation Patient Details Name: [name] MRN: [id number] DOB: 28-Jun-1940 Today's Date: 07/26/2013 Time: [id number] PT Time Calculation (min): 17 min  PT Assessment / Plan / Recommendation History of Present Illness  pt s/p R TKA  Clinical Impression  Pt with impaired mobility, strength and ROM.  Will benefit from skilled PT to address deficits and increase functional independence.    PT Assessment  Patient needs continued PT services    Follow Up Recommendations  Home health PT    Does the patient have the potential to tolerate intense rehabilitation      Barriers to Discharge Decreased caregiver support lives alone    Equipment Recommendations  None recommended by PT    Recommendations for Other Services     Frequency 7X/week    Precautions / Restrictions Precautions Precautions: Knee Required Braces or Orthoses: Knee Immobilizer - Right Knee Immobilizer - Right: Other (comment) (No order, KI in room so PT donned for OOB) Restrictions Weight Bearing Restrictions: Yes RLE Weight Bearing: Weight bearing as tolerated   Pertinent Vitals/Pain No c/o pain      Mobility  Bed Mobility Overal bed mobility: Modified Independent General bed mobility comments: uses rail Transfers Overall transfer level: Needs assistance Equipment used: Rolling walker (2 wheeled) Transfers: Sit to/from Stand Sit to Stand: Supervision General transfer comment: pt able to stand from bed and recliner wiht supervision, cues for UE placement Ambulation/Gait Ambulation/Gait assistance: Supervision Ambulation Distance (Feet): 40 Feet Assistive device: Rolling walker (2 wheeled) Gait velocity interpretation: Below normal speed for age/gender General Gait Details: pt with step to pattern, improves wtih cues, no LOB    Exercises     PT Diagnosis: Abnormality of gait;Generalized weakness  PT Problem List: Decreased strength;Decreased mobility;Decreased activity  tolerance;Decreased range of motion;Decreased balance;Decreased knowledge of use of DME PT Treatment Interventions: Patient/family education;Therapeutic activities;Functional mobility training;Neuromuscular re-education;Stair training;Modalities;Balance training;Gait training;DME instruction;Therapeutic exercise;Wheelchair mobility training     PT Goals(Current goals can be found in the care plan section) Acute Rehab PT Goals Patient Stated Goal: go home friday PT Goal Formulation: With patient Time For Goal Achievement: 08/02/13 Potential to Achieve Goals: Good  Visit Information  Last PT Received On: 07/26/13 Assistance Needed: +1 History of Present Illness: pt s/p R TKA       Prior Functioning  Home Living Family/patient expects to be discharged to:: Private residence Living Arrangements: Alone Available Help at Discharge: Family;Available PRN/intermittently Type of Home: House Home Access: Stairs to enter Enter[employer] CorporationEntrance Stairs-Number of Steps: 3 Entrance Stairs-Rails: Can reach both Home Layout: One level Home Equipment: Walker - 2 wheels Prior Function Level of Independence: Independent Communication Communication: No difficulties    Cognition  Cognition Arousal/Alertness: Awake/alert Behavior During Therapy: WFL for tasks assessed/performed Overall Cognitive Status: Within Functional Limits for tasks assessed    Extremity/Trunk Assessment Lower Extremity Assessment Lower Extremity Assessment: RLE deficits/detail RLE Deficits / Details: ROM decreased by pain, strength grossly 3-/5 Cervical / Trunk Assessment Cervical / Trunk Assessment: Normal   Balance    End of Session PT - End of Session Activity Tolerance: Patient tolerated treatment well Patient left: in chair;with call bell/phone within reach Nurse Communication: Mobility status CPM Right Knee CPM Right Knee: Off Right Knee Flexion (Degrees): 60 Right Knee Extension (Degrees): 0 Additional Comments:  completed 1.5hour  GP     [name] 07/26/2013, 10:10 AM

## 2013-07-26 NOTE — Progress Notes (Signed)
Physical Therapy Treatment Patient Details Name: [name] MRN: [id number] DOB: March 17, 1941 Today's Date: 07/26/2013 Time: [id number] PT Time Calculation (min): 26 min  PT Assessment / Plan / Recommendation  History of Present Illness s/p R TKA   PT Comments   Pt able to gait with mod I and perform stairs with supervision.  Will continue to benefit from skilled PT for R LE strengthening and ROM.  Follow Up Recommendations  Home health PT     Does the patient have the potential to tolerate intense rehabilitation     Barriers to Discharge Decreased caregiver support lives alone    Equipment Recommendations  None recommended by PT    Recommendations for Other Services    Frequency 7X/week   Progress towards PT Goals Progress towards PT goals: Progressing toward goals  Plan Current plan remains appropriate    Precautions / Restrictions Precautions Precautions: Knee Required Braces or Orthoses: Knee Immobilizer - Right Knee Immobilizer - Right: Other (comment) (No order, KI in room so OT donned for OOB) Restrictions Weight Bearing Restrictions: Yes RLE Weight Bearing: Weight bearing as tolerated   Pertinent Vitals/Pain Pt reports pain only with quad set knee extension on R, eases when out of position    Mobility  Bed Mobility Overal bed mobility: Modified Independent General bed mobility comments: no rail used after vc to try EOB to supine without using rails Transfers Overall transfer level: Modified independent Equipment used: Rolling walker (2 wheeled) Transfers: Sit to/from Stand Sit to Stand: Supervision General transfer comment: pt able to stand for 3 n 1, recliner and bed without assistance Ambulation/Gait Ambulation/Gait assistance: Supervision Ambulation Distance (Feet): 100 Feet (100' x 2) Assistive device: Rolling walker (2 wheeled) Gait velocity interpretation: Below normal speed for age/gender General Gait Details: step through pattern Stairs: Yes Stairs  assistance: Supervision Stair Management: Two rails Number of Stairs: 5 General stair comments: pt already aware of "Up with good, down with bad", no cues needed    Exercises Total Joint Exercises Ankle Circles/Pumps: AROM;Both;20 reps Quad Sets: AROM;10 reps;Right Short Arc Quad: AROM;Right;10 reps Heel Slides: AAROM;Right;10 reps Hip ABduction/ADduction: AROM;10 reps;Right   PT Diagnosis: Abnormality of gait;Generalized weakness  PT Problem List: Decreased strength;Decreased mobility;Decreased activity tolerance;Decreased range of motion;Decreased balance;Decreased knowledge of use of DME PT Treatment Interventions: Patient/family education;Therapeutic activities;Functional mobility training;Neuromuscular re-education;Stair training;Modalities;Balance training;Gait training;DME instruction;Therapeutic exercise;Wheelchair mobility training   PT Goals (current goals can now be found in the care plan section) Acute Rehab PT Goals Patient Stated Goal: not stated PT Goal Formulation: With patient Time For Goal Achievement: 08/02/13 Potential to Achieve Goals: Good  Visit Information  Last PT Received On: 07/26/13 Assistance Needed: +1 History of Present Illness: s/p R TKA    Subjective Data  Patient Stated Goal: not stated   Cognition  Cognition Arousal/Alertness: Awake/alert Behavior During Therapy: WFL for tasks assessed/performed Overall Cognitive Status: Within Functional Limits for tasks assessed    Balance     End of Session PT - End of Session Equipment Utilized During Treatment: Right knee immobilizer Activity Tolerance: Patient tolerated treatment well Patient left: in bed;in CPM;with call bell/phone within reach Nurse Communication: Mobility status CPM Right Knee CPM Right Knee: On   GP     Paz Fuentes 07/26/2013, 1:28 PM

## 2013-07-27 MED ORDER — DOCUSATE SODIUM 100 MG PO CAPS: 100.0000 mg | ORAL_CAPSULE | Freq: Two times a day (BID) | ORAL | Status: DC

## 2013-07-27 MED ORDER — ONDANSETRON HCL 4 MG PO TABS: 4.0000 mg | ORAL_TABLET | Freq: Three times a day (TID) | ORAL | Status: DC | PRN

## 2013-07-27 MED ORDER — HYDROCODONE-ACETAMINOPHEN 5-325 MG PO TABS: 2.0000 | ORAL_TABLET | ORAL | Status: DC | PRN

## 2013-07-27 MED ORDER — ASPIRIN EC 325 MG PO TBEC: 325.0000 mg | DELAYED_RELEASE_TABLET | Freq: Every day | ORAL | Status: DC

## 2013-07-27 NOTE — Progress Notes (Signed)
I participated in the care of this patient and agree with the above history, physical and evaluation. I performed a review of the history and a physical exam as detailed   Timothy Daniel Murphy MD  

## 2013-07-27 NOTE — Progress Notes (Signed)
Physical Therapy Treatment Patient Details Name: [name] MRN: [id number] DOB: 1940-06-03 Today's Date: 07/27/2013 Time: [id number] PT Time Calculation (min): 24 min  PT Assessment / Plan / Recommendation  History of Present Illness s/p R TKA   PT Comments   Pt tolerated treatment well and is progressing in her ambulatory distance of up to 150 feet with supervision. PT will continue to work on independence with functional mobility. Stairs to be practiced again this afternoon, and reinforce understanding of home exercise program.   Follow Up Recommendations  Home health PT     Does the patient have the potential to tolerate intense rehabilitation     Barriers to Discharge        Equipment Recommendations  None recommended by PT    Recommendations for Other Services    Frequency 7X/week   Progress towards PT Goals Progress towards PT goals: Progressing toward goals  Plan Current plan remains appropriate    Precautions / Restrictions Precautions Precautions: Knee Restrictions Weight Bearing Restrictions: Yes RLE Weight Bearing: Weight bearing as tolerated   Pertinent Vitals/Pain 3/10 pain, pt states she recently received medications Pt repositioned for comfort in bed with Footsie Roll in place for optimal knee extension    Mobility  Bed Mobility Overal bed mobility: Modified Independent General bed mobility comments: No assist required for pt supine<>sit, requires extra time Transfers Overall transfer level: Modified independent Equipment used: Rolling walker (2 wheeled) Transfers: Sit to/from Stand General transfer comment: Pt stands from bed without verbal cues for hand placement. Ambulation/Gait Ambulation/Gait assistance: Supervision Ambulation Distance (Feet): 150 Feet Assistive device: Rolling walker (2 wheeled) Gait Pattern/deviations: Step-through pattern;Decreased stance time - right;Antalgic;Trunk flexed;Decreased step length - left General Gait Details:  Intermittent verbal cues for upright posture, forward gaze, and to incrase step length on L.    Exercises Total Joint Exercises Ankle Circles/Pumps: AROM;Both;20 reps;Supine Quad Sets: AROM;Right;5 reps;Supine Short Arc Quad: AROM;Right;10 reps;Supine Long Arc Quad: AAROM;Right;10 reps;Seated Knee Flexion: AAROM;Right;5 reps;Seated   PT Diagnosis:    PT Problem List:   PT Treatment Interventions:     PT Goals (current goals can now be found in the care plan section) Acute Rehab PT Goals PT Goal Formulation: With patient Time For Goal Achievement: 08/02/13 Potential to Achieve Goals: Good  Visit Information  Last PT Received On: 07/27/13 Assistance Needed: +1 History of Present Illness: s/p R TKA    Subjective Data  Subjective: "I think I over did it yesterday, because I'm sore"   Cognition  Cognition Arousal/Alertness: Awake/alert Behavior During Therapy: WFL for tasks assessed/performed Overall Cognitive Status: Within Functional Limits for tasks assessed    Balance     End of Session PT - End of Session Equipment Utilized During Treatment: Gait belt Activity Tolerance: Patient tolerated treatment well Patient left: in bed;with call bell/phone within reach [employer](Footsie Roll in place)   GP    BJ[facility] WholesaleLogan Secor Tavious Griesinger, PT [phone]  [place], Misty Foutz S 07/27/2013, 10:21 AM

## 2013-07-27 NOTE — Progress Notes (Signed)
Patient ID: [name], female   DOB: 04/06/1941, 73 y.o.   MRN: [id number]     Subjective:  Patient reports pain as mild.  Patient reports some increased soreness.  Denies any CP or SOB    Objective:   VITALS:   Filed Vitals:   07/26/13 0540 07/26/13 1400 07/26/13 2033 07/27/13 0553  BP: 133/62 121/58 118/63 125/73  Pulse: 99 98 92 87  Temp: 98 F (36.7 C) 98.9 F (37.2 C) 98.1 F (36.7 C) 98.2 F (36.8 C)  TempSrc:      Resp: 16 16 18 18   SpO2: 95% 93% 92% 97%    ABD soft Sensation intact distally Dorsiflexion/Plantar flexion intact Incision: dressing C/D/I and no drainage   Lab Results  Component Value Date   WBC 14.0* 07/26/2013   HGB 11.7* 07/26/2013   HCT 35.8* 07/26/2013   MCV 90.9 07/26/2013   PLT 307 07/26/2013     Assessment/Plan: 2 Days Post-Op   Active Problems:   Arthritis of knee   Advance diet Up with therapy Plan for discharge tomorrow WBAT Dry dressing PRN     Haskel KhanDOUGLAS Jaliyah Fotheringham 07/27/2013, 7:28 AM   Margarita Ranaimothy Murphy MD [phone]

## 2013-07-27 NOTE — Progress Notes (Signed)
Physical Therapy Treatment Note  Pt safely navigated stairs with supervision and is ambulating household distances with supervision showing good control of R knee. Will continue to work with pt for strengthening and independence with functional mobility.  "minimal pain at the moment" Pt repositioned for comfort supine in bed with CPM at 0-70 degrees.    07/27/13 1400  PT Visit Information  Last PT Received On 07/27/13  Assistance Needed +1  History of Present Illness s/p R TKA  PT Time Calculation  PT Start Time 1333  PT Stop Time 1401  PT Time Calculation (min) 28 min  Subjective Data  Subjective Would like to be placed in CPM machine after therapy session  Precautions  Precautions Knee  Restrictions  Weight Bearing Restrictions Yes  RLE Weight Bearing WBAT  Cognition  Arousal/Alertness Awake/alert  Behavior During Therapy WFL for tasks assessed/performed  Overall Cognitive Status Within Functional Limits for tasks assessed  Bed Mobility  Overal bed mobility Modified Independent  General bed mobility comments No assist required for pt supine<>sit, requires extra time. Bed in flat position and instructed not to use rails to simulate home environment  Transfers  Overall transfer level Needs assistance  Equipment used Rolling walker (2 wheeled)  Transfers Sit to/from Stand  Sit to Stand Supervision  General transfer comment Required verbal cue for hand placment. verbalizes understanding.  Ambulation/Gait  Ambulation/Gait assistance Supervision  Ambulation Distance (Feet) 180 Feet  Assistive device Rolling walker (2 wheeled)  Gait Pattern/deviations Step-through pattern;Decreased stance time - right;Antalgic  General Gait Details Intermittent verbal cues for upright posture, forward gaze, and to straighten knee in stance phase.  Stairs Yes  Stairs assistance Supervision  Stair Management One rail Right;Sideways;Step to pattern  Number of Stairs 5 (x2)  General stair  comments Verbal cues for leading with affected leg during descent. Able to demonstrate understanding on second attempt  PT - End of Session  Equipment Utilized During Treatment Gait belt  Activity Tolerance Patient tolerated treatment well  Patient left in bed;in CPM;with call bell/phone within reach (0-70 degrees CPM)  PT - Assessment/Plan  PT Plan Current plan remains appropriate  PT Frequency 7X/week  Follow Up Recommendations Home health PT  PT equipment None recommended by PT  PT Goal Progression  Progress towards PT goals Progressing toward goals  Acute Rehab PT Goals  PT Goal Formulation With patient  Time For Goal Achievement 08/02/13  Potential to Achieve Goals Good  PT General Charges  $$ ACUTE PT VISIT 1 Procedure  PT Treatments  $Gait Training 8-22 mins  $Therapeutic Activity 8-22 mins    [address] DriveLogan Secor Kinston[place]bour, [place] CarolinaPT [phone]

## 2013-07-27 NOTE — Discharge Instructions (Signed)
Ok to bear weight as tolerated  Work on knee extension by elevated with the foot on foam roll  Take aspirin 325 daily.

## 2013-07-27 NOTE — Discharge Summary (Signed)
Physician Discharge Summary  Patient ID: [name] MRN: [id number] DOB/AGE: 31-Jan-1941 73 y.o.  Admit date: 07/25/2013 Discharge date: 07/27/2013  Admission Diagnoses:  <principal problem not specified>  Discharge Diagnoses:  Active Problems:   Arthritis of knee   Past Medical History  Diagnosis Date  . Hypertension   . Migraine     "one time" (07/26/2013)  . Arthritis     "knees" (07/26/2013)    Surgeries: Procedure(s): RIGHT TOTAL KNEE ARTHROPLASTY on 07/25/2013   Consultants (if any):    Discharged Condition: Improved  Hospital Course: [name] is an 73 y.o. female who was admitted 07/25/2013 with a diagnosis of <principal problem not specified> and went to the operating room on 07/25/2013 and underwent the above named procedures.    She was given perioperative antibiotics:  Anti-infectives   Start     Dose/Rate Route Frequency Ordered Stop   07/25/13 1700  ceFAZolin (ANCEF) IVPB 2 g/50 mL premix     2 g 100 mL/hr over 30 Minutes Intravenous Every 6 hours 07/25/13 1447 07/26/13 0028   07/25/13 0600  ceFAZolin (ANCEF) IVPB 2 g/50 mL premix     2 g 100 mL/hr over 30 Minutes Intravenous On call to O.R. 07/24/13 1259 07/25/13 1100    .  She was given sequential compression devices, early ambulation, and asa 325 for DVT prophylaxis.  She benefited maximally from the hospital stay and there were no complications.    Recent vital signs:  Filed Vitals:   07/27/13 0553  BP: 125/73  Pulse: 87  Temp: 98.2 F (36.8 C)  Resp: 18    Recent laboratory studies:  Lab Results  Component Value Date   HGB 11.7* 07/26/2013   HGB 13.6 07/14/2013   Lab Results  Component Value Date   WBC 14.0* 07/26/2013   PLT 307 07/26/2013   Lab Results  Component Value Date   INR 1.04 07/14/2013   Lab Results  Component Value Date   NA 140 07/14/2013   K 3.9 07/14/2013   CL 99 07/14/2013   CO2 28 07/14/2013   BUN 19 07/14/2013   CREATININE 0.89 07/14/2013   GLUCOSE 107* 07/14/2013     Discharge Medications:     Medication List    STOP taking these medications       meloxicam 15 MG tablet  Commonly known as:  MOBIC     naproxen sodium 220 MG tablet  Commonly known as:  ANAPROX      TAKE these medications       aspirin EC 325 MG tablet  Take 1 tablet (325 mg total) by mouth daily.     docusate sodium 100 MG capsule  Commonly known as:  COLACE  Take 1 capsule (100 mg total) by mouth 2 (two) times daily. Continue this while taking narcotics to help with bowel movements     HYDROcodone-acetaminophen 5-325 MG per tablet  Commonly known as:  NORCO  Take 2 tablets by mouth every 4 (four) hours as needed.     lisinopril 10 MG tablet  Commonly known as:  PRINIVIL,ZESTRIL  Take 10 mg by mouth daily.     multivitamin with minerals Tabs tablet  Take 1 tablet by mouth daily.     ondansetron 4 MG tablet  Commonly known as:  ZOFRAN  Take 1 tablet (4 mg total) by mouth every 8 (eight) hours as needed for nausea.        Diagnostic Studies: Dg Chest 2 View  07/14/2013  CLINICAL DATA:  Preoperative evaluation for right total knee arthroplasty, hypertension  EXAM: CHEST  2 VIEW  COMPARISON:  None  FINDINGS: Normal heart size, mediastinal contours, and pulmonary vascularity.  Lungs clear.  No pleural effusion or pneumothorax.  Bones unremarkable.  IMPRESSION: Normal exam.   Electronically Signed   By: Ulyses SouthwardMark  Boles M.D.   On: 07/14/2013 10:02   Dg Knee 1-2 Views Right  07/26/2013   CLINICAL DATA:  73 year old female status post surgery. Initial encounter.  EXAM: RIGHT KNEE - 1-2 VIEW  COMPARISON:  None.  FINDINGS: Portable AP and cross-table lateral views of the right knee demonstrating sequelae of total knee arthroplasty. Hardware appears intact and normally aligned. Postoperative changes to the patella. Postoperative changes to the surrounding soft tissues. No unexpected osseous finding identified.  IMPRESSION: Right total knee arthroplasty with no adverse features  identified.   Electronically Signed   By: Augusto GambleLee  Hall M.D.   On: 07/26/2013 14:25    Disposition: Final discharge disposition not confirmed        Follow-up Information   Follow up with Sheral ApleyMURPHY, Taleah Bellantoni, D, MD. (as scheduled)    Specialty:  Orthopedic Surgery   Contact information:   [address] [place] [id number] [phone]        Signed: Margarita RanaMURPHY, Glanda Spanbauer, D 07/27/2013, 7:04 AM

## 2013-07-27 NOTE — Care Management Note (Signed)
CARE MANAGEMENT NOTE 07/27/2013  Patient:  [name]   Account Number:  [id number]  Date Initiated:  07/27/2013  Documentation initiated by:  Vance PeperBRADY,Leonda Cristo  Subjective/Objective Assessment:   73 yr old female s/p right total knee arthroplasty.     Action/Plan:   Patient preoperatively setup with [facility], no changes. DME has been delivered. patient will have support at discharge.   Anticipated DC Date:  07/28/2013   Anticipated DC Plan:  HOME W HOME HEALTH SERVICES      DC Planning Services  CM consult      PAC Choice  DURABLE MEDICAL EQUIPMENT  HOME HEALTH   Choice offered to / List presented to:  C-1 Patient   DME arranged  [facility]  [facility]  3-N-1      DME agency  [employer]     HH arranged  HH-2 PT      HH agency  [facility]   Status of service:  Completed, signed off Medicare Important Message given?   (If response is "NO", the following Medicare IM given date fields will be blank) Date Medicare IM given:   Date Additional Medicare IM given:    Discharge Disposition:  HOME W HOME HEALTH SERVICES  Per UR Regulation

## 2013-07-28 NOTE — Progress Notes (Signed)
Physical Therapy Treatment Patient Details Name: [name] MRN: [id number] DOB: 03/11/1941 Today's Date: 07/28/2013 Time: [id number] PT Time Calculation (min): 18 min  PT Assessment / Plan / Recommendation  History of Present Illness s/p R TKA   PT Comments   Pt continues to progress towards goals, demonstrates ability to safely follow home exercise program and ambulate distances of 150 and greater. Pt is appropriate for discharge to home health PT from a functional mobility standpoint.  Follow Up Recommendations  Home health PT     Does the patient have the potential to tolerate intense rehabilitation     Barriers to Discharge        Equipment Recommendations  None recommended by PT    Recommendations for Other Services    Frequency 7X/week   Progress towards PT Goals Progress towards PT goals: Progressing toward goals  Plan Current plan remains appropriate    Precautions / Restrictions Precautions Precautions: Knee Required Braces or Orthoses: Knee Immobilizer - Right Knee Immobilizer - Right:  (no order, KI in room so OT donned for OOB) Restrictions Weight Bearing Restrictions: Yes RLE Weight Bearing: Weight bearing as tolerated   Pertinent Vitals/Pain 4/10 pain, pt states she just received medicine for pain Repositioned in reclining chair for comfort.    Mobility  Transfers Overall transfer level: Needs assistance Equipment used: Rolling walker (2 wheeled) Transfers: Sit to/from Stand Sit to Stand: Supervision General transfer comment: Supervision for safety, pt leans over RW and needs cues to stand upright. Ambulation/Gait Ambulation/Gait assistance: Supervision Ambulation Distance (Feet): 150 Feet Assistive device: Rolling walker (2 wheeled) Gait Pattern/deviations: Step-through pattern;Decreased step length - left;Decreased stance time - right;Antalgic General Gait Details: Gait training focused on increasing L step length for step-through pattern. Verbal  cues for upright posture, forward gaze, and to straighten knee in stance phase.    Exercises Total Joint Exercises Ankle Circles/Pumps: AROM;Both;20 reps;Seated Quad Sets: AROM;Right;Seated;10 reps Knee Flexion: AROM;5 reps;Seated Goniometric ROM: 12-95 degrees flexion R knee   PT Diagnosis:    PT Problem List:   PT Treatment Interventions:     PT Goals (current goals can now be found in the care plan section) Acute Rehab PT Goals Patient Stated Goal: not stated PT Goal Formulation: With patient Time For Goal Achievement: 08/02/13 Potential to Achieve Goals: Good  Visit Information  Last PT Received On: 07/28/13 Assistance Needed: +1 History of Present Illness: s/p R TKA    Subjective Data  Subjective: Ready to go home as soon as possible Patient Stated Goal: not stated   Cognition  Cognition Arousal/Alertness: Awake/alert Behavior During Therapy: WFL for tasks assessed/performed Overall Cognitive Status: Within Functional Limits for tasks assessed    Balance     End of Session PT - End of Session Activity Tolerance: Patient tolerated treatment well Patient left: with call bell/phone within reach;in chair;with family/visitor present (0-70 degrees CPM) CPM Right Knee CPM Right Knee: Off   GP    [facility] Sec[facility] Deep RunBarbour, [place] CarolinaPT [phone]  [place] Mount[place], Abrham Maslowski S 07/28/2013, 10:49 AM

## 2013-07-28 NOTE — Progress Notes (Signed)
Occupational Therapy Treatment Patient Details Name: [name] MRN: [id number] DOB: August 14, 1940 Today's Date: 07/28/2013 Time: [id number] OT Time Calculation (min): 22 min  OT Assessment / Plan / Recommendation  History of present illness s/p R TKA   OT comments  Pt progressing well toward acute OT goals; tolerated session well.   Follow Up Recommendations  No OT follow up;Supervision - Intermittent    Barriers to Discharge       Equipment Recommendations  None recommended by OT    Recommendations for Other Services    Frequency Min 2X/week   Progress towards OT Goals Progress towards OT goals: Progressing toward goals  Plan Discharge plan remains appropriate    Precautions / Restrictions Precautions Precautions: Knee Required Braces or Orthoses: Knee Immobilizer - Right Knee Immobilizer - Right:  (no order, KI in room so OT donned for ambulation) Restrictions Weight Bearing Restrictions: Yes RLE Weight Bearing: Weight bearing as tolerated   Pertinent Vitals/Pain Pain 5/10. Increased activity during session.    ADL  Grooming: Wash/dry hands;Supervision/safety;Set up Where Assessed - Grooming: Supported standing Upper Body Dressing: Set up Where Assessed - Upper Body Dressing: Supported sitting Lower Body Dressing: Supervision/safety Where Assessed - Lower Body Dressing: Supported sit to Pharmacist, hospitalstand Toilet Transfer: Supervision/safety StatisticianToilet Transfer Method: Sit to Baristastand Toilet Transfer Equipment: Raised toilet seat with arms (or 3-in-1 over toilet) Toileting - Clothing Manipulation and Hygiene: Supervision/safety Where Assessed - Engineer, miningToileting Clothing Manipulation and Hygiene: Sit to stand from 3-in-1 or toilet Tub/Shower Transfer: Set up;Supervision/safety Tub/Shower Transfer Method: Stand pivot Tub/Shower Transfer Equipment: Walk in shower;Other (comment) (3 in 1 ) Equipment Used: Knee Immobilizer;Gait belt;Rolling walker Transfers/Ambulation Related to ADLs: Supervision   ADL Comments: pt doffed socks and donned footwear in supported seating with no AE. Pt donned LB dressing (pants) in supported sit to stand with supervision. pt ambulated to 3 in 1 with supervision. Pt educated on safe shower techniques. Pt performed toilet transfer and toilet clothing manipulation supervision. Pt ambulated and transfered stand to sit in chair with superision.    OT Diagnosis:    OT Problem List:   OT Treatment Interventions:     OT Goals(current goals can now be found in the care plan section) Acute Rehab OT Goals Patient Stated Goal: not stated OT Goal Formulation: With patient Time For Goal Achievement: 08/02/13 Potential to Achieve Goals: Good ADL Goals Pt Will Perform Grooming: with supervision;standing Pt Will Perform Lower Body Bathing: with modified independence;sit to/from stand Pt Will Perform Lower Body Dressing: with modified independence;sit to/from stand Pt Will Perform Tub/Shower Transfer: Shower transfer;with supervision;ambulating;3 in 1  Visit Information  Last OT Received On: 07/28/13 Assistance Needed: +1 History of Present Illness: s/p R TKA    Subjective Data      Prior Functioning       Cognition  Cognition Arousal/Alertness: Awake/alert Behavior During Therapy: WFL for tasks assessed/performed Overall Cognitive Status: Within Functional Limits for tasks assessed    Mobility  Transfers Overall transfer level: Needs assistance Equipment used: Rolling walker (2 wheeled) Transfers: Sit to/from Stand Sit to Stand: Supervision General transfer comment: supervision for safety    Exercises      Balance    End of Session OT - End of Session Equipment Utilized During Treatment: Gait belt;Rolling walker;Right knee immobilizer Activity Tolerance: Patient tolerated treatment well Patient left: in bed;with call bell/phone within reach CPM Right Knee CPM Right Knee: Off  GO     Pilar GrammesMathews, Quynn Vilchis H OTR/L 07/28/2013, 9:45 AM

## 2013-11-14 NOTE — H&P (Signed)
  [facility] [address]   [address] [place], [place] [id number] [phone] A Division of [facility]  Loreta Aveaniel F. Murphy, M.D.   Robert A. Thurston HoleWainer, M.D.   Burnell BlanksW. Dan Caffrey, M.D.   Eulas PostJoshua P. Landau, M.D.   Lunette StandsAnna Voytek, M.D Jewel Baizeimothy D. Eulah PontMurphy, M.D.  Buford DresserWesley R. Ibazebo, M.D.  Estell HarpinJames S. Kramer, M.D.    Melina Fiddlerebecca S. Bassett, M.D. Mary L. Isidoro DonningAnton, PA-C  Kirstin A. Shepperson, PA-C  Josh Weirhadwell, PA-C [place]Brandon Parry, [place] DakotaOPA-C                                                                    RE: [name], [name]                                    [id number]      DOB: 1940-06-02 PROGRESS NOTE: 11-10-13 Reason for visit: Follow up right total knee arthroplasty and severe left knee arthritis.  Date of surgery: July 25, 2013.   History of present illness: Minimal pain at this time.  Occasional soreness.  She has finished physical therapy.  She has no systemic symptoms.   Please see associated documentation for this clinic visit for further past medical, family, surgical and social history, review of systems, and exam findings as this was reviewed by me.  EXAMINATION: The left knee has tenderness at both medial and lateral joint lines.  Good range of motion.  X-RAYS: None today.  Previous films demonstrate bilateral severe arthritis.    ASSESSMENT/PLAN: Doing well with regards to her right total knee.  She is ready for a left total knee and this is scheduled for July 21st.  I had a discussion again about her options and the risks and benefits and she would again like to go forward with this procedure.  I will see her postoperatively.   Jewel Baizeimothy D.  Eulah PontMurphy, M.D.  Electronically verified by Jewel Baizeimothy D. Eulah PontMurphy, M.D. TDM:jjh D 11-10-13 T 11-10-13

## 2013-11-28 ENCOUNTER — Encounter (HOSPITAL_COMMUNITY): Payer: Self-pay | Admitting: Pharmacy Technician

## 2013-12-01 ENCOUNTER — Encounter (HOSPITAL_COMMUNITY)
Admission: RE | Admit: 2013-12-01 | Discharge: 2013-12-01 | Disposition: A | Discharge status: Home / Self Care | Payer: Medicare Other | Source: Ambulatory Visit | Attending: Orthopedic Surgery | Admitting: Orthopedic Surgery

## 2013-12-01 ENCOUNTER — Encounter (HOSPITAL_COMMUNITY): Payer: Self-pay

## 2013-12-01 DIAGNOSIS — Z01812 Encounter for preprocedural laboratory examination: Secondary | ICD-10-CM | POA: Insufficient documentation

## 2013-12-01 HISTORY — DX: Shortness of breath: R06.02

## 2013-12-01 HISTORY — DX: Other specified postprocedural states: Z98.890

## 2013-12-01 HISTORY — DX: Nausea with vomiting, unspecified: R11.2

## 2013-12-01 LAB — CBC
HCT: 41.3 % (ref 36.0–46.0)
Hemoglobin: 13.4 g/dL (ref 12.0–15.0)
MCH: 28.9 pg (ref 26.0–34.0)
MCHC: 32.4 g/dL (ref 30.0–36.0)
MCV: 89 fL (ref 78.0–100.0)
Platelets: 356 10*3/uL (ref 150–400)
RBC: 4.64 MIL/uL (ref 3.87–5.11)
RDW: 13.8 % (ref 11.5–15.5)
WBC: 9.1 10*3/uL (ref 4.0–10.5)

## 2013-12-01 LAB — URINE MICROSCOPIC-ADD ON

## 2013-12-01 LAB — URINALYSIS, ROUTINE W REFLEX MICROSCOPIC
Bilirubin Urine: NEGATIVE
GLUCOSE, UA: NEGATIVE mg/dL
KETONES UR: NEGATIVE mg/dL
Nitrite: NEGATIVE
Protein, ur: NEGATIVE mg/dL
Specific Gravity, Urine: 1.023 (ref 1.005–1.030)
Urobilinogen, UA: 0.2 mg/dL (ref 0.0–1.0)
pH: 6.5 (ref 5.0–8.0)

## 2013-12-01 LAB — BASIC METABOLIC PANEL
ANION GAP: 13 (ref 5–15)
BUN: 16 mg/dL (ref 6–23)
CALCIUM: 9.8 mg/dL (ref 8.4–10.5)
CHLORIDE: 98 meq/L (ref 96–112)
CO2: 28 mEq/L (ref 19–32)
Creatinine, Ser: 0.83 mg/dL (ref 0.50–1.10)
GFR calc Af Amer: 81 mL/min — ABNORMAL LOW (ref >90)
GFR calc non Af Amer: 70 mL/min — ABNORMAL LOW (ref >90)
Glucose, Bld: 114 mg/dL — ABNORMAL HIGH (ref 70–99)
POTASSIUM: 4.7 meq/L (ref 3.7–5.3)
Sodium: 139 mEq/L (ref 137–147)

## 2013-12-01 LAB — SURGICAL PCR SCREEN
MRSA, PCR: NEGATIVE
Staphylococcus aureus: NEGATIVE

## 2013-12-01 LAB — TYPE AND SCREEN
ABO/RH(D): A POS
ANTIBODY SCREEN: NEGATIVE

## 2013-12-01 LAB — PROTIME-INR
INR: 1.08 (ref 0.00–1.49)
Prothrombin Time: 14 seconds (ref 11.6–15.2)

## 2013-12-01 LAB — APTT: aPTT: 31 seconds (ref 24–37)

## 2013-12-01 NOTE — Pre-Procedure Instructions (Signed)
Amelia JoDonna Cavey  12/01/2013   Your procedure is scheduled on:  Tuesday, July21.  [facility].  Call this number if you have problems the morning of surgery: [phone]   Remember:   Do not eat food or drink liquids after midnight Monday, July 20.   Take these medicines the morning of surgery with A SIP OF WATER: None.              On July 14, Stop taking Aspirin, Aspirin Products, Vitamins and Herbal Products.   Do not wear jewelry, make-up or nail polish.  Do not wear lotions, powders, or perfume.  Do not shave 48 hours prior to surgery.   Do not bring valuables to the hospital.             [facility] any belongings or valuables.               Contacts, dentures or bridgework may not be worn into surgery.  Leave suitcase in the car. After surgery it may be brought to your room.  For patients admitted to the hospital, discharge time is determined by your treatment team.                 Special Instructions: Review  [facility] - Preparing For Surgery.   Please read over the following fact sheets that you were given: Pain Booklet, Coughing and Deep Breathing, Blood Transfusion Information and Surgical Site Infection Prevention

## 2013-12-02 LAB — URINE CULTURE: Colony Count: 3000

## 2013-12-11 MED ORDER — CEFAZOLIN SODIUM-DEXTROSE 2-3 GM-% IV SOLR
2.0000 g | INTRAVENOUS | Status: AC
  Administered 2013-12-12: 2 g via INTRAVENOUS
  Filled 2013-12-11: qty 50

## 2013-12-11 MED ORDER — ACETAMINOPHEN 500 MG PO TABS
1000.0000 mg | ORAL_TABLET | Freq: Once | ORAL | Status: AC
  Administered 2013-12-12: 1000 mg via ORAL
  Filled 2013-12-11: qty 2

## 2013-12-11 MED ORDER — DEXTROSE-NACL 5-0.45 % IV SOLN: 100.0000 mL/h | INTRAVENOUS | Status: DC

## 2013-12-11 MED ORDER — CHLORHEXIDINE GLUCONATE 4 % EX LIQD
60.0000 mL | Freq: Once | CUTANEOUS | Status: DC
  Filled 2013-12-11: qty 60

## 2013-12-12 ENCOUNTER — Encounter (HOSPITAL_COMMUNITY): Payer: Self-pay | Admitting: *Deleted

## 2013-12-12 ENCOUNTER — Encounter (HOSPITAL_COMMUNITY): Payer: Medicare Other | Admitting: Anesthesiology

## 2013-12-12 ENCOUNTER — Inpatient Hospital Stay (HOSPITAL_COMMUNITY): Payer: Medicare Other

## 2013-12-12 ENCOUNTER — Inpatient Hospital Stay (HOSPITAL_COMMUNITY)
Admission: RE | Admit: 2013-12-12 | Discharge: 2013-12-15 | DRG: 470 | Disposition: A | Discharge status: Home / Self Care | Payer: Medicare Other | Source: Ambulatory Visit | Attending: Orthopedic Surgery | Admitting: Orthopedic Surgery

## 2013-12-12 ENCOUNTER — Encounter (HOSPITAL_COMMUNITY)
Admission: RE | Disposition: A | Discharge status: Home / Self Care | Payer: Self-pay | Source: Ambulatory Visit | Attending: Orthopedic Surgery

## 2013-12-12 ENCOUNTER — Inpatient Hospital Stay (HOSPITAL_COMMUNITY): Payer: Medicare Other | Admitting: Anesthesiology

## 2013-12-12 DIAGNOSIS — Z791 Long term (current) use of non-steroidal anti-inflammatories (NSAID): Secondary | ICD-10-CM

## 2013-12-12 DIAGNOSIS — Z96659 Presence of unspecified artificial knee joint: Secondary | ICD-10-CM

## 2013-12-12 DIAGNOSIS — I10 Essential (primary) hypertension: Secondary | ICD-10-CM | POA: Diagnosis present

## 2013-12-12 DIAGNOSIS — Z79899 Other long term (current) drug therapy: Secondary | ICD-10-CM

## 2013-12-12 DIAGNOSIS — Z7982 Long term (current) use of aspirin: Secondary | ICD-10-CM

## 2013-12-12 DIAGNOSIS — Z87891 Personal history of nicotine dependence: Secondary | ICD-10-CM

## 2013-12-12 DIAGNOSIS — Z6838 Body mass index (BMI) 38.0-38.9, adult: Secondary | ICD-10-CM

## 2013-12-12 DIAGNOSIS — M1710 Unilateral primary osteoarthritis, unspecified knee: Secondary | ICD-10-CM | POA: Diagnosis present

## 2013-12-12 DIAGNOSIS — J449 Chronic obstructive pulmonary disease, unspecified: Secondary | ICD-10-CM | POA: Diagnosis present

## 2013-12-12 DIAGNOSIS — M25569 Pain in unspecified knee: Secondary | ICD-10-CM | POA: Diagnosis present

## 2013-12-12 DIAGNOSIS — J4489 Other specified chronic obstructive pulmonary disease: Secondary | ICD-10-CM | POA: Diagnosis present

## 2013-12-12 HISTORY — PX: TOTAL KNEE ARTHROPLASTY: SHX125

## 2013-12-12 SURGERY — ARTHROPLASTY, KNEE, TOTAL
Anesthesia: General | Laterality: Left

## 2013-12-12 MED ORDER — MENTHOL 3 MG MT LOZG: 1.0000 | LOZENGE | OROMUCOSAL | Status: DC | PRN

## 2013-12-12 MED ORDER — ONDANSETRON HCL 4 MG PO TABS
4.0000 mg | ORAL_TABLET | Freq: Four times a day (QID) | ORAL | Status: DC | PRN
  Administered 2013-12-14 – 2013-12-15 (×2): 4 mg via ORAL
  Filled 2013-12-12 (×2): qty 1

## 2013-12-12 MED ORDER — SODIUM CHLORIDE 0.9 % IR SOLN
Status: DC | PRN
  Administered 2013-12-12: 3000 mL

## 2013-12-12 MED ORDER — METOCLOPRAMIDE HCL 10 MG PO TABS: 5.0000 mg | ORAL_TABLET | Freq: Three times a day (TID) | ORAL | Status: DC | PRN

## 2013-12-12 MED ORDER — CEFAZOLIN SODIUM-DEXTROSE 2-3 GM-% IV SOLR
2.0000 g | Freq: Four times a day (QID) | INTRAVENOUS | Status: AC
  Administered 2013-12-12 (×2): 2 g via INTRAVENOUS
  Filled 2013-12-12 (×2): qty 50

## 2013-12-12 MED ORDER — OXYCODONE HCL 5 MG/5ML PO SOLN: 5.0000 mg | Freq: Once | ORAL | Status: DC | PRN

## 2013-12-12 MED ORDER — CELECOXIB 200 MG PO CAPS: 200.0000 mg | ORAL_CAPSULE | Freq: Two times a day (BID) | ORAL | Status: AC

## 2013-12-12 MED ORDER — ALBUMIN HUMAN 5 % IV SOLN
12.5000 g | Freq: Once | INTRAVENOUS | Status: AC
  Administered 2013-12-12: 12.5 g via INTRAVENOUS

## 2013-12-12 MED ORDER — PROPOFOL 10 MG/ML IV BOLUS
INTRAVENOUS | Status: AC
  Filled 2013-12-12: qty 20

## 2013-12-12 MED ORDER — ACETAMINOPHEN 325 MG PO TABS: 650.0000 mg | ORAL_TABLET | Freq: Four times a day (QID) | ORAL | Status: DC | PRN

## 2013-12-12 MED ORDER — SODIUM CHLORIDE 0.9 % IR SOLN
Status: DC | PRN
  Administered 2013-12-12: 1000 mL

## 2013-12-12 MED ORDER — METHOCARBAMOL 500 MG PO TABS
ORAL_TABLET | ORAL | Status: AC
  Filled 2013-12-12: qty 1

## 2013-12-12 MED ORDER — BUPIVACAINE LIPOSOME 1.3 % IJ SUSP
INTRAMUSCULAR | Status: DC | PRN
  Administered 2013-12-12: 20 mL

## 2013-12-12 MED ORDER — CELECOXIB 200 MG PO CAPS
200.0000 mg | ORAL_CAPSULE | Freq: Two times a day (BID) | ORAL | Status: DC
  Administered 2013-12-12 – 2013-12-14 (×6): 200 mg via ORAL
  Filled 2013-12-12 (×8): qty 1

## 2013-12-12 MED ORDER — DEXAMETHASONE 6 MG PO TABS
10.0000 mg | ORAL_TABLET | Freq: Three times a day (TID) | ORAL | Status: AC
  Administered 2013-12-12 – 2013-12-13 (×3): 10 mg via ORAL
  Filled 2013-12-12 (×3): qty 1

## 2013-12-12 MED ORDER — OXYCODONE HCL 5 MG PO TABS: 10.0000 mg | ORAL_TABLET | ORAL | Status: AC | PRN

## 2013-12-12 MED ORDER — LISINOPRIL 10 MG PO TABS
10.0000 mg | ORAL_TABLET | Freq: Every day | ORAL | Status: DC
  Administered 2013-12-12 – 2013-12-13 (×2): 10 mg via ORAL
  Filled 2013-12-12 (×4): qty 1

## 2013-12-12 MED ORDER — HYDROMORPHONE HCL PF 1 MG/ML IJ SOLN
INTRAMUSCULAR | Status: AC
  Filled 2013-12-12: qty 1

## 2013-12-12 MED ORDER — ASPIRIN EC 325 MG PO TBEC
325.0000 mg | DELAYED_RELEASE_TABLET | Freq: Every day | ORAL | Status: DC
  Administered 2013-12-13 – 2013-12-15 (×3): 325 mg via ORAL
  Filled 2013-12-12 (×4): qty 1

## 2013-12-12 MED ORDER — FENTANYL CITRATE 0.05 MG/ML IJ SOLN
INTRAMUSCULAR | Status: AC
  Filled 2013-12-12: qty 5

## 2013-12-12 MED ORDER — METHOCARBAMOL 500 MG PO TABS
500.0000 mg | ORAL_TABLET | Freq: Four times a day (QID) | ORAL | Status: DC | PRN
  Administered 2013-12-12 – 2013-12-13 (×5): 500 mg via ORAL
  Filled 2013-12-12 (×4): qty 1

## 2013-12-12 MED ORDER — ACETAMINOPHEN 650 MG RE SUPP: 650.0000 mg | Freq: Four times a day (QID) | RECTAL | Status: DC | PRN

## 2013-12-12 MED ORDER — OXYCODONE HCL 5 MG PO TABS
5.0000 mg | ORAL_TABLET | ORAL | Status: DC | PRN
  Administered 2013-12-12 – 2013-12-14 (×9): 10 mg via ORAL
  Administered 2013-12-14 – 2013-12-15 (×2): 5 mg via ORAL
  Filled 2013-12-12: qty 1
  Filled 2013-12-12 (×2): qty 2
  Filled 2013-12-12: qty 1
  Filled 2013-12-12 (×7): qty 2

## 2013-12-12 MED ORDER — MORPHINE SULFATE 2 MG/ML IJ SOLN
2.0000 mg | INTRAMUSCULAR | Status: DC | PRN
  Administered 2013-12-12: 2 mg via INTRAVENOUS
  Filled 2013-12-12: qty 1

## 2013-12-12 MED ORDER — LACTATED RINGERS IV SOLN
INTRAVENOUS | Status: DC | PRN
  Administered 2013-12-12 (×2): via INTRAVENOUS

## 2013-12-12 MED ORDER — ONDANSETRON HCL 4 MG/2ML IJ SOLN
4.0000 mg | Freq: Four times a day (QID) | INTRAMUSCULAR | Status: DC | PRN
  Administered 2013-12-12 (×2): 4 mg via INTRAVENOUS
  Filled 2013-12-12 (×2): qty 2

## 2013-12-12 MED ORDER — LABETALOL HCL 5 MG/ML IV SOLN
INTRAVENOUS | Status: AC
  Filled 2013-12-12: qty 4

## 2013-12-12 MED ORDER — ONDANSETRON HCL 4 MG PO TABS: 4.0000 mg | ORAL_TABLET | Freq: Three times a day (TID) | ORAL | Status: AC | PRN

## 2013-12-12 MED ORDER — DEXAMETHASONE SODIUM PHOSPHATE 10 MG/ML IJ SOLN
10.0000 mg | Freq: Three times a day (TID) | INTRAMUSCULAR | Status: AC
  Filled 2013-12-12 (×3): qty 1

## 2013-12-12 MED ORDER — MIDAZOLAM HCL 5 MG/5ML IJ SOLN
INTRAMUSCULAR | Status: DC | PRN
  Administered 2013-12-12 (×2): 1 mg via INTRAVENOUS

## 2013-12-12 MED ORDER — FENTANYL CITRATE 0.05 MG/ML IJ SOLN
INTRAMUSCULAR | Status: DC | PRN
  Administered 2013-12-12 (×2): 50 ug via INTRAVENOUS
  Administered 2013-12-12: 150 ug via INTRAVENOUS
  Administered 2013-12-12 (×2): 50 ug via INTRAVENOUS

## 2013-12-12 MED ORDER — MIDAZOLAM HCL 2 MG/2ML IJ SOLN
INTRAMUSCULAR | Status: AC
  Filled 2013-12-12: qty 2

## 2013-12-12 MED ORDER — SODIUM CHLORIDE 0.9 % IJ SOLN
INTRAMUSCULAR | Status: DC | PRN
  Administered 2013-12-12: 40 mL

## 2013-12-12 MED ORDER — DEXTROSE-NACL 5-0.45 % IV SOLN
INTRAVENOUS | Status: AC
  Administered 2013-12-12: 15:00:00 via INTRAVENOUS

## 2013-12-12 MED ORDER — ASPIRIN EC 325 MG PO TBEC: 325.0000 mg | DELAYED_RELEASE_TABLET | Freq: Every day | ORAL | Status: AC

## 2013-12-12 MED ORDER — PROPOFOL 10 MG/ML IV BOLUS
INTRAVENOUS | Status: DC | PRN
  Administered 2013-12-12: 150 mg via INTRAVENOUS

## 2013-12-12 MED ORDER — SUCCINYLCHOLINE CHLORIDE 20 MG/ML IJ SOLN
INTRAMUSCULAR | Status: AC
  Filled 2013-12-12: qty 1

## 2013-12-12 MED ORDER — METOCLOPRAMIDE HCL 5 MG/ML IJ SOLN
5.0000 mg | Freq: Three times a day (TID) | INTRAMUSCULAR | Status: DC | PRN
  Administered 2013-12-12: 10 mg via INTRAVENOUS
  Filled 2013-12-12: qty 2

## 2013-12-12 MED ORDER — MEPERIDINE HCL 25 MG/ML IJ SOLN: 6.2500 mg | INTRAMUSCULAR | Status: DC | PRN

## 2013-12-12 MED ORDER — DOCUSATE SODIUM 100 MG PO CAPS
100.0000 mg | ORAL_CAPSULE | Freq: Two times a day (BID) | ORAL | Status: DC
  Administered 2013-12-12 – 2013-12-14 (×6): 100 mg via ORAL
  Filled 2013-12-12 (×8): qty 1

## 2013-12-12 MED ORDER — ALBUMIN HUMAN 5 % IV SOLN
INTRAVENOUS | Status: AC
  Filled 2013-12-12: qty 250

## 2013-12-12 MED ORDER — OXYCODONE HCL 5 MG PO TABS: 5.0000 mg | ORAL_TABLET | Freq: Once | ORAL | Status: DC | PRN

## 2013-12-12 MED ORDER — ONDANSETRON HCL 4 MG/2ML IJ SOLN
INTRAMUSCULAR | Status: DC | PRN
  Administered 2013-12-12: 4 mg via INTRAVENOUS

## 2013-12-12 MED ORDER — ACETAMINOPHEN 500 MG PO TABS
ORAL_TABLET | ORAL | Status: AC
  Filled 2013-12-12: qty 2

## 2013-12-12 MED ORDER — LABETALOL HCL 5 MG/ML IV SOLN
5.0000 mg | INTRAVENOUS | Status: DC | PRN
  Administered 2013-12-12 (×2): 5 mg via INTRAVENOUS

## 2013-12-12 MED ORDER — METHOCARBAMOL 1000 MG/10ML IJ SOLN
500.0000 mg | Freq: Four times a day (QID) | INTRAVENOUS | Status: DC | PRN
  Filled 2013-12-12: qty 5

## 2013-12-12 MED ORDER — HYDROMORPHONE HCL PF 1 MG/ML IJ SOLN
INTRAMUSCULAR | Status: DC | PRN
  Administered 2013-12-12 (×4): .25 mg via INTRAVENOUS

## 2013-12-12 MED ORDER — SCOPOLAMINE 1 MG/3DAYS TD PT72
1.0000 | MEDICATED_PATCH | TRANSDERMAL | Status: DC
  Administered 2013-12-12: 1 via TRANSDERMAL

## 2013-12-12 MED ORDER — LIDOCAINE HCL (CARDIAC) 20 MG/ML IV SOLN
INTRAVENOUS | Status: DC | PRN
  Administered 2013-12-12: 30 mg via INTRAVENOUS

## 2013-12-12 MED ORDER — PHENOL 1.4 % MT LIQD: 1.0000 | OROMUCOSAL | Status: DC | PRN

## 2013-12-12 MED ORDER — HYDROMORPHONE HCL PF 1 MG/ML IJ SOLN
0.2500 mg | INTRAMUSCULAR | Status: DC | PRN
  Administered 2013-12-12: 0.5 mg via INTRAVENOUS

## 2013-12-12 MED ORDER — PROMETHAZINE HCL 25 MG/ML IJ SOLN: 6.2500 mg | INTRAMUSCULAR | Status: DC | PRN

## 2013-12-12 MED ORDER — DOCUSATE SODIUM 100 MG PO CAPS: 100.0000 mg | ORAL_CAPSULE | Freq: Two times a day (BID) | ORAL | Status: AC

## 2013-12-12 SURGICAL SUPPLY — 66 items
BANDAGE ESMARK 6X9 LF (GAUZE/BANDAGES/DRESSINGS) ×1 IMPLANT
BENZOIN TINCTURE PRP APPL 2/3 (GAUZE/BANDAGES/DRESSINGS) ×3 IMPLANT
BLADE SAG 18X100X1.27 (BLADE) ×6 IMPLANT
BNDG COHESIVE 6X5 TAN STRL LF (GAUZE/BANDAGES/DRESSINGS) ×3 IMPLANT
BNDG ELASTIC 6X10 VLCR STRL LF (GAUZE/BANDAGES/DRESSINGS) ×3 IMPLANT
BNDG ESMARK 6X9 LF (GAUZE/BANDAGES/DRESSINGS) ×3
BOWL SMART MIX CTS (DISPOSABLE) ×3 IMPLANT
CEMENT BONE SIMPLEX SPEEDSET (Cement) ×6 IMPLANT
CLOSURE STERI-STRIP 1/2X4 (GAUZE/BANDAGES/DRESSINGS) ×1
CLOSURE WOUND 1/2 X4 (GAUZE/BANDAGES/DRESSINGS)
CLSR STERI-STRIP ANTIMIC 1/2X4 (GAUZE/BANDAGES/DRESSINGS) ×2 IMPLANT
COVER SURGICAL LIGHT HANDLE (MISCELLANEOUS) ×3 IMPLANT
CUFF TOURNIQUET SINGLE 34IN LL (TOURNIQUET CUFF) ×3 IMPLANT
DRAPE EXTREMITY T 121X128X90 (DRAPE) ×3 IMPLANT
DRAPE IMP U-DRAPE 54X76 (DRAPES) ×3 IMPLANT
DRAPE PROXIMA HALF (DRAPES) ×3 IMPLANT
DRAPE U-SHAPE 47X51 STRL (DRAPES) ×3 IMPLANT
DRSG ADAPTIC 3X8 NADH LF (GAUZE/BANDAGES/DRESSINGS) IMPLANT
DRSG PAD ABDOMINAL 8X10 ST (GAUZE/BANDAGES/DRESSINGS) ×3 IMPLANT
DURAPREP 26ML APPLICATOR (WOUND CARE) ×6 IMPLANT
ELECT CAUTERY BLADE 6.4 (BLADE) ×3 IMPLANT
ELECT REM PT RETURN 9FT ADLT (ELECTROSURGICAL) ×3
ELECTRODE REM PT RTRN 9FT ADLT (ELECTROSURGICAL) ×1 IMPLANT
FACESHIELD WRAPAROUND (MASK) ×6 IMPLANT
GLOVE BIO SURGEON STRL SZ7.5 (GLOVE) ×3 IMPLANT
GLOVE BIOGEL PI IND STRL 8 (GLOVE) ×1 IMPLANT
GLOVE BIOGEL PI INDICATOR 8 (GLOVE) ×2
GOWN STRL REUS W/ TWL LRG LVL3 (GOWN DISPOSABLE) ×2 IMPLANT
GOWN STRL REUS W/ TWL XL LVL3 (GOWN DISPOSABLE) IMPLANT
GOWN STRL REUS W/TWL LRG LVL3 (GOWN DISPOSABLE) ×4
GOWN STRL REUS W/TWL XL LVL3 (GOWN DISPOSABLE)
HANDPIECE INTERPULSE COAX TIP (DISPOSABLE) ×2
IMMOBILIZER KNEE 20 (SOFTGOODS) ×3
IMMOBILIZER KNEE 20 THIGH 36 (SOFTGOODS) ×1 IMPLANT
IMMOBILIZER KNEE 22 UNIV (SOFTGOODS) IMPLANT
IMMOBILIZER KNEE 24 THIGH 36 (MISCELLANEOUS) IMPLANT
IMMOBILIZER KNEE 24 UNIV (MISCELLANEOUS)
KIT BASIN OR (CUSTOM PROCEDURE TRAY) ×3 IMPLANT
KIT ROOM TURNOVER OR (KITS) ×3 IMPLANT
KNEE/VIT E POLY LINER LEVEL 1B ×3 IMPLANT
MANIFOLD NEPTUNE II (INSTRUMENTS) ×3 IMPLANT
NEEDLE 18GX1X1/2 (RX/OR ONLY) (NEEDLE) ×3 IMPLANT
NS IRRIG 1000ML POUR BTL (IV SOLUTION) ×3 IMPLANT
PACK TOTAL JOINT (CUSTOM PROCEDURE TRAY) ×3 IMPLANT
PAD ABD 8X10 STRL (GAUZE/BANDAGES/DRESSINGS) ×3 IMPLANT
PAD ARMBOARD 7.5X6 YLW CONV (MISCELLANEOUS) ×6 IMPLANT
PAD CAST 4YDX4 CTTN HI CHSV (CAST SUPPLIES) ×1 IMPLANT
PADDING CAST COTTON 4X4 STRL (CAST SUPPLIES) ×2
PADDING CAST COTTON 6X4 STRL (CAST SUPPLIES) ×3 IMPLANT
SET HNDPC FAN SPRY TIP SCT (DISPOSABLE) ×1 IMPLANT
SPONGE GAUZE 4X4 12PLY (GAUZE/BANDAGES/DRESSINGS) ×3 IMPLANT
SPONGE GAUZE 4X4 12PLY STER LF (GAUZE/BANDAGES/DRESSINGS) ×3 IMPLANT
STAPLER VISISTAT 35W (STAPLE) IMPLANT
STRIP CLOSURE SKIN 1/2X4 (GAUZE/BANDAGES/DRESSINGS) IMPLANT
SUCTION FRAZIER TIP 10 FR DISP (SUCTIONS) ×3 IMPLANT
SUT MNCRL AB 4-0 PS2 18 (SUTURE) ×6 IMPLANT
SUT MON AB 2-0 CT1 27 (SUTURE) ×3 IMPLANT
SUT VIC AB 0 CT1 27 (SUTURE) ×2
SUT VIC AB 0 CT1 27XBRD ANBCTR (SUTURE) ×1 IMPLANT
SUT VIC AB 1 CTX 36 (SUTURE) ×2
SUT VIC AB 1 CTX36XBRD ANBCTR (SUTURE) ×1 IMPLANT
SYR 50ML LL SCALE MARK (SYRINGE) ×3 IMPLANT
TOWEL OR 17X24 6PK STRL BLUE (TOWEL DISPOSABLE) ×3 IMPLANT
TOWEL OR 17X26 10 PK STRL BLUE (TOWEL DISPOSABLE) ×3 IMPLANT
TRAY FOLEY BAG SILVER LF 14FR (CATHETERS) IMPLANT
WATER STERILE IRR 1000ML POUR (IV SOLUTION) IMPLANT

## 2013-12-12 NOTE — Op Note (Addendum)
DATE OF SURGERY:  12/12/2013 TIME: 8:55 AM  PATIENT NAME:  [name]   AGE: 73 y.o.    PRE-OPERATIVE DIAGNOSIS:  OA LEFT KNEE  POST-OPERATIVE DIAGNOSIS:  Same  PROCEDURE:  Procedure(s): LEFT TOTAL KNEE ARTHROPLASTY   SURGEON:  Margarita Rana, D, MD   ASSISTANT:  Janace Litten OPA   OPERATIVE IMPLANTS: Stryker Triathlon Posterior Stabilized.  Femur size 3, Tibia size 3, Patella size 29 3-peg oval button, with a 9 mm polyethylene insert.   PREOPERATIVE INDICATIONS:  [name] is a 73 y.o. year old female with end stage bone on bone degenerative arthritis of the knee who failed conservative treatment, including injections, antiinflammatories, activity modification, and assistive devices, and had significant impairment of their activities of daily living, and elected for Total Knee Arthroplasty.   The risks, benefits, and alternatives were discussed at length including but not limited to the risks of infection, bleeding, nerve injury, stiffness, blood clots, the need for revision surgery, cardiopulmonary complications, among others, and they were willing to proceed.   OPERATIVE DESCRIPTION:  The patient was brought to the operative room and placed in a supine position.  General anesthesia was administered.  IV antibiotics were given.  The lower extremity was prepped and draped in the usual sterile fashion.  Time out was performed.  The leg was elevated and exsanguinated and the tourniquet was inflated.  Anterior approach was performed.  The patella was everted and osteophytes were removed.  The anterior horn of the medial and lateral meniscus was removed.   The distal femur was opened with the drill and the intramedullary distal femoral cutting jig was utilized, set at 5 degrees resecting 10 mm off the distal femur.  Care was taken to protect the collateral ligaments.  The distal femoral sizing jig was applied, taking care to avoid notching.  Then the 4-in-1 cutting jig was  applied and the anterior and posterior femur was cut, along with the chamfer cuts.  All posterior osteophytes were removed.  The flexion gap was then measured and was symmetric with the extension gap.  Then the extramedullary tibial cutting jig was utilized making the appropriate cut using the anterior tibial crest as a reference building in appropriate posterior slope.  Care was taken during the cut to protect the medial and collateral ligaments.  The proximal tibia was removed along with the posterior horns of the menisci.  The PCL was sacrificed.    The extensor gap was measured and was approximately 9mm.    I completed the distal femoral preparation using the appropriate jig to prepare the box.  The patella was then measured, and cut with the saw.    The proximal tibia sized and prepared accordingly with the reamer and the punch, and then all components were trialed with the 9mm poly insert.  The knee was found to have excellent balance and full motion.    The above named components were then cemented into place and all excess cement was removed.  The real polyethylene implant was placed.  Exparel was injected in the soft tissue  The knee was easily taken through a range of motion and the patella tracked well and the knee irrigated copiously and the parapatellar and subcutaneous tissue closed with vicryl, and monocryl with steri strips for the skin.  The wounds were injected with exparell, and dressed with sterile gauze and the tourniquet released and the patient was awakened and returned to the PACU in stable and satisfactory condition.  There were no  complications.  Total tourniquet time was roughly 60 minutes.   POSTOPERATIVE PLAN: post op Abx, DVT px: ASA 325 daily, SCD's and Ted hose

## 2013-12-12 NOTE — Discharge Instructions (Signed)
Keep your incision covered and dry  Take ASA 325 daily for 30 days  Work on knee extenstion  Bear weight as tolerated

## 2013-12-12 NOTE — Progress Notes (Signed)
rec'd report from Hess CorporationSharon RN

## 2013-12-12 NOTE — Progress Notes (Signed)
Dr Jean Rosenthaljackson paged regarding patients elevated heart rate.  Ordered to get the patient more comfortable, then call if heart rate persists high

## 2013-12-12 NOTE — Progress Notes (Signed)
Orthopedic Tech Progress Note Patient Details:  [name] 1940/06/15 [id number]  CPM Left Knee CPM Left Knee: On Left Knee Flexion (Degrees): 90 Left Knee Extension (Degrees): 0 Additional Comments: Trapeze bar and foot roll   Shawnie PonsCammer, [name] [name] 12/12/2013, 10:36 AM

## 2013-12-12 NOTE — Anesthesia Postprocedure Evaluation (Signed)
  Anesthesia Post-op Note  Patient: [name]  Procedure(s) Performed: Procedure(s): LEFT TOTAL KNEE ARTHROPLASTY (Left)  Patient Location: PACU  Anesthesia Type:General  Level of Consciousness: awake, alert , oriented and patient cooperative  Airway and Oxygen Therapy: Patient Spontanous Breathing and Patient connected to nasal cannula oxygen  Post-op Pain: none  Post-op Assessment: Post-op Vital signs reviewed, Patient's Cardiovascular Status Stable, Respiratory Function Stable, Patent Airway, No signs of Nausea or vomiting and Pain level controlled  Post-op Vital Signs: Reviewed and stable  Last Vitals:  Filed Vitals:   12/12/13 1200  BP: 121/92  Pulse:   Temp: 36.3 C  Resp:     Complications: No apparent anesthesia complications

## 2013-12-12 NOTE — Progress Notes (Signed)
Released from PACU by Dr. Jean RosenthalJackson

## 2013-12-12 NOTE — Anesthesia Preprocedure Evaluation (Addendum)
Anesthesia Evaluation  Patient identified by MRN, date of birth, ID band Patient awake    Reviewed: Allergy & Precautions, H&P , NPO status , Patient's Chart, lab work & pertinent test results  History of Anesthesia Complications (+) PONV and history of anesthetic complications  Airway Mallampati: II TM Distance: >3 FB Neck ROM: Full    Dental  (+) Edentulous Upper, Dental Advisory Given   Pulmonary COPDformer smoker (quit 30 years),  breath sounds clear to auscultation        Cardiovascular hypertension, Pt. on medications - anginaRhythm:Regular Rate:Normal     Neuro/Psych  Headaches,    GI/Hepatic negative GI ROS, Neg liver ROS,   Endo/Other  Morbid obesity  Renal/GU negative Renal ROS     Musculoskeletal   Abdominal (+) + obese,   Peds  Hematology negative hematology ROS (+)   Anesthesia Other Findings   Reproductive/Obstetrics                       Anesthesia Physical Anesthesia Plan  ASA: III  Anesthesia Plan: General   Post-op Pain Management:    Induction: Intravenous  Airway Management Planned: LMA  Additional Equipment:   Intra-op Plan:   Post-operative Plan:   Informed Consent: I have reviewed the patients History and Physical, chart, labs and discussed the procedure including the risks, benefits and alternatives for the proposed anesthesia with the patient or authorized representative who has indicated his/her understanding and acceptance.   Dental advisory given  Plan Discussed with: CRNA and Surgeon  Anesthesia Plan Comments: (Plan routine monitors, GA- LMA OK)       Anesthesia Quick Evaluation

## 2013-12-12 NOTE — Transfer of Care (Signed)
Immediate Anesthesia Transfer of Care Note  Patient: [name]  Procedure(s) Performed: Procedure(s): LEFT TOTAL KNEE ARTHROPLASTY (Left)  Patient Location: PACU  Anesthesia Type:General  Level of Consciousness: awake and alert   Airway & Oxygen Therapy: Patient Spontanous Breathing and Patient connected to face mask oxygen  Post-op Assessment: Report given to PACU RN and Post -op Vital signs reviewed and stable  Post vital signs: Reviewed and stable  Complications: No apparent anesthesia complications

## 2013-12-12 NOTE — Interval H&P Note (Signed)
History and Physical Interval Note:  12/12/2013 7:18 AM  [name]  has presented today for surgery, with the diagnosis of OA LEFT KNEE  The various methods of treatment have been discussed with the patient and family. After consideration of risks, benefits and other options for treatment, the patient has consented to  Procedure(s): TOTAL KNEE ARTHROPLASTY (Left) as a surgical intervention .  The patient's history has been reviewed, patient examined, no change in status, stable for surgery.  I have reviewed the patient's chart and labs.  Questions were answered to the patient's satisfaction.     Lekha Dancer, D

## 2013-12-12 NOTE — Progress Notes (Signed)
Utilization review completed.  

## 2013-12-12 NOTE — Progress Notes (Signed)
Called Dr.Jackson r/t pt continues to be hypertensive and tachycardic. Orders received to treat with labetalol and order pt's lisinopril to give in pacu. Will cont to treat pain and monitor closely.

## 2013-12-13 ENCOUNTER — Encounter (HOSPITAL_COMMUNITY): Payer: Self-pay | Admitting: Orthopedic Surgery

## 2013-12-13 NOTE — Evaluation (Signed)
Occupational Therapy Evaluation Patient Details Name: Delonna Ney MRN: [id number] DOB: 11-Oct-1940 Today's Date: 12/13/2013    History of Present Illness pt presents with L TKA.     Clinical Impression   Pt seen for acute OT evaluation s/p L TKA. PT had R TKA in March 2015. Reviewed ADL education and techniques with AE with pt demonstrating good understanding. No further OT needs at this time.    Follow Up Recommendations       Equipment Recommendations  None recommended by OT;Other (comment) (Pt has recommended equipment)    Recommendations for Other Services       Precautions / Restrictions Precautions Precautions: Fall Required Braces or Orthoses: Knee Immobilizer - Left Knee Immobilizer - Left: On when out of bed or walking;Discontinue once straight leg raise with < 10 degree lag Restrictions Weight Bearing Restrictions: Yes LLE Weight Bearing: Weight bearing as tolerated         Balance                                            ADL Overall ADL's : Needs assistance/impaired Eating/Feeding: Set up;Sitting   Grooming: Set up;Sitting;Standing   Upper Body Bathing: Set up;Sitting   Lower Body Bathing: Min A;Sit to/from stand   Upper Body Dressing : Set up;Sitting   Lower Body Dressing: Min A;Sit to/from stand   Toilet Transfer: Min guard;Ambulation;BSC;RW   Toileting- Architect and Hygiene: Min A;Sit to/from stand   Tub/ Shower Transfer: Min guard;Ambulation;3 in 1;Rolling walker   Functional mobility during ADLs: Min guard;Rolling walker General ADL Comments: Reviewed ADL techniques. Pt R TKA in March 2015. Pt demonstrated good knowledge of ADL techniques.     Vision                     Perception     Praxis      Pertinent Vitals/Pain 4/10 L knee     Hand Dominance     Extremity/Trunk Assessment Upper Extremity Assessment Upper Extremity Assessment: Overall WFL for tasks assessed   Lower Extremity  Assessment Lower Extremity Assessment: Defer to PT evaluation    Cervical / Trunk Assessment Cervical / Trunk Assessment: Normal   Communication Communication Communication: No difficulties   Cognition Arousal/Alertness: Awake/alert Behavior During Therapy: WFL for tasks assessed/performed Overall Cognitive Status: Within Functional Limits for tasks assessed                     General Comments       Exercises      Shoulder Instructions      Home Living Family/patient expects to be discharged to:: Private residence Living Arrangements: Alone Available Help at Discharge: Family;Available 24 hours/day Type of Home: House Home Access: Stairs to enter [employer] of Steps: 4 Entrance Stairs-Rails: Right;Left Home Layout: One level     Bathroom Shower/Tub: Producer, television/film/video: Handicapped height Bathroom Accessibility: Yes How Accessible: Accessible via walker Home Equipment: Walker - 2 wheels;Toilet riser;Shower seat;Cane - single point;Adaptive equipment Adaptive Equipment: Sock aid;Reacher        Prior Functioning/Environment Level of Independence: Independent             OT Diagnosis:     OT Problem List:     OT Treatment/Interventions:      OT Goals(Current goals can be found in the care plan section)  Acute Rehab OT Goals Patient Stated Goal: Walk without pain.    OT Frequency:     Barriers to D/C:            Co-evaluation              End of Session CPM Left Knee CPM Left Knee: Off  Activity Tolerance:   Patient left: in chair;with call bell/phone within reach   Time: [id number] OT Time Calculation (min): 6 min Charges:  OT General Charges $OT Visit: 1 Procedure OT Evaluation $Initial OT Evaluation Tier I: 1 Procedure G-Codes:    Pilar GrammesMathews, Brewer Hitchman H 12/13/2013, 1:02 PM

## 2013-12-13 NOTE — Clinical Social Work Note (Signed)
Clinical Social Worker received standing order referral for possible ST-SNF placement.  Chart reviewed.  PT/OT recommending home with home health and intermittent supervision.  Spoke with RN Case Manager who will follow up with patient to discuss home health needs.    CSW signing off - please re consult if social work needs arise.  Macario GoldsJesse Zedric Deroy, [place] [phone]

## 2013-12-13 NOTE — Progress Notes (Signed)
Physical Therapy Treatment Patient Details Name: [name] MRN: [id number] DOB: Jun 22, 1940 Today's Date: 12/13/2013    History of Present Illness pt presents with L TKA.      PT Comments    Pt very motivated and moving well.  Feel will continue to make great progress.    Follow Up Recommendations  Home health PT;Supervision - Intermittent     Equipment Recommendations  None recommended by PT    Recommendations for Other Services       Precautions / Restrictions Precautions Precautions: Fall Required Braces or Orthoses: Knee Immobilizer - Left Knee Immobilizer - Left: On when out of bed or walking;Discontinue once straight leg raise with < 10 degree lag Restrictions Weight Bearing Restrictions: Yes LLE Weight Bearing: Weight bearing as tolerated    Mobility  Bed Mobility Overal bed mobility: Needs Assistance Bed Mobility: Supine to Sit     Supine to sit: Min assist     General bed mobility comments: A with L LE only.    Transfers Overall transfer level: Needs assistance Equipment used: Rolling walker (2 wheeled) Transfers: Sit to/from Stand Sit to Stand: Mod assist         General transfer comment: Increased A needed to come to stand from recliner this pm.  pt indicates 1-2 staff members have been helping her to bathroom.    Ambulation/Gait Ambulation/Gait assistance: Min guard Ambulation Distance (Feet): 160 Feet Assistive device: Rolling walker (2 wheeled) Gait Pattern/deviations: Step-to pattern;Decreased step length - right;Decreased stance time - left;Trunk flexed     General Gait Details: cues for upright posture and encouragement.  pt able to increase amb distance and indicates no increase in pain.     Stairs            Wheelchair Mobility    Modified Rankin (Stroke Patients Only)       Balance                                    Cognition Arousal/Alertness: Awake/alert Behavior During Therapy: WFL for tasks  assessed/performed Overall Cognitive Status: Within Functional Limits for tasks assessed                      Exercises Total Joint Exercises Ankle Circles/Pumps: AROM;Both;10 reps Quad Sets: AROM;Both;10 reps Long Arc Quad: AAROM;Left;10 reps Knee Flexion: AAROM;Left;10 reps    General Comments        Pertinent Vitals/Pain 5/10.  Premedicated.      Home Living Family/patient expects to be discharged to:: Private residence Living Arrangements: Alone Available Help at Discharge: Family;Available 24 hours/day Type of Home: House Home Access: Stairs to enter Entrance Stairs-Rails: Right;Left Home Layout: One level Home Equipment: Environmental consultant - 2 wheels;Toilet riser;Shower seat;Cane - single point;Adaptive equipment      Prior Function Level of Independence: Independent          PT Goals (current goals can now be found in the care plan section) Acute Rehab PT Goals Patient Stated Goal: Walk without pain.   PT Goal Formulation: With patient Time For Goal Achievement: 12/20/13 Potential to Achieve Goals: Good Progress towards PT goals: Progressing toward goals    Frequency  7X/week    PT Plan Current plan remains appropriate    Co-evaluation             End of Session Equipment Utilized During Treatment: Gait belt;Left knee immobilizer Activity Tolerance: Patient  tolerated treatment well Patient left: in chair;with call bell/phone within reach     Time: 1337-1401 PT Time Calculation (min): 24 min  Charges:  $Gait Training: 8-22 mins $Therapeutic Exercise: 8-22 mins                    G CodesSunny Schlein:      Melika Reder F, [place] [phone] 12/13/2013, 2:30 PM

## 2013-12-13 NOTE — Evaluation (Signed)
Physical Therapy Evaluation Patient Details Name: [name] MRN: [id number] DOB: May 24, 1941 Today's Date: 12/13/2013   History of Present Illness  pt presents with L TKA.    Clinical Impression  Pt very motivated and moving well.  Pt notes having R TKA in March and demos good recall of safety with mobility.  Anticipate great progress and will continue to follow.      Follow Up Recommendations Home health PT;Supervision - Intermittent    Equipment Recommendations  None recommended by PT    Recommendations for Other Services       Precautions / Restrictions Precautions Precautions: Fall Required Braces or Orthoses: Knee Immobilizer - Left Knee Immobilizer - Left: On when out of bed or walking;Discontinue once straight leg raise with < 10 degree lag Restrictions Weight Bearing Restrictions: Yes LLE Weight Bearing: Weight bearing as tolerated      Mobility  Bed Mobility Overal bed mobility: Needs Assistance Bed Mobility: Supine to Sit     Supine to sit: Min assist     General bed mobility comments: A with L LE only.    Transfers Overall transfer level: Needs assistance Equipment used: Rolling walker (2 wheeled) Transfers: Sit to/from Stand Sit to Stand: Min assist         General transfer comment: cues for UE use and to get closer prior to sitting.    Ambulation/Gait Ambulation/Gait assistance: Min guard Ambulation Distance (Feet): 80 Feet Assistive device: Rolling walker (2 wheeled) Gait Pattern/deviations: Step-to pattern;Decreased step length - right;Decreased stance time - left     General Gait Details: cues for gait sequencing and upright posture.    Stairs            Wheelchair Mobility    Modified Rankin (Stroke Patients Only)       Balance                                             Pertinent Vitals/Pain 6-7/10 Premedicated.      Home Living Family/patient expects to be discharged to:: Private  residence Living Arrangements: Alone Available Help at Discharge: Family;Available 24 hours/day Type of Home: House Home Access: Stairs to enter Entrance Stairs-Rails: Doctor, general practiceight;Left Entrance Stairs-Number of Steps: 4 Home Layout: One level Home Equipment: Walker - 2 wheels;Toilet riser;Shower seat;Cane - single point;Adaptive equipment      Prior Function Level of Independence: Independent               Hand Dominance        Extremity/Trunk Assessment   Upper Extremity Assessment: Defer to OT evaluation           Lower Extremity Assessment: LLE deficits/detail   LLE Deficits / Details: AAROM ~10 - 75  Cervical / Trunk Assessment: Normal  Communication   Communication: No difficulties  Cognition Arousal/Alertness: Awake/alert Behavior During Therapy: WFL for tasks assessed/performed Overall Cognitive Status: Within Functional Limits for tasks assessed                      General Comments      Exercises Total Joint Exercises Ankle Circles/Pumps: AROM;Both;10 reps Quad Sets: AROM;Both;10 reps Long Arc Quad: AAROM;Left;10 reps Knee Flexion: AAROM;Left;10 reps      Assessment/Plan    PT Assessment Patient needs continued PT services  PT Diagnosis Abnormality of gait;Acute pain   PT Problem List Decreased strength;Decreased range  of motion;Decreased activity tolerance;Decreased balance;Decreased mobility;Decreased knowledge of use of DME;Pain  PT Treatment Interventions DME instruction;Gait training;Stair training;Functional mobility training;Therapeutic activities;Therapeutic exercise;Balance training;Patient/family education   PT Goals (Current goals can be found in the Care Plan section) Acute Rehab PT Goals Patient Stated Goal: Walk without pain.   PT Goal Formulation: With patient Time For Goal Achievement: 12/20/13 Potential to Achieve Goals: Good    Frequency 7X/week   Barriers to discharge        Co-evaluation                End of Session Equipment Utilized During Treatment: Gait belt;Left knee immobilizer Activity Tolerance: Patient tolerated treatment well Patient left: in chair;with call bell/phone within reach Nurse Communication: Mobility status         Time: 8119-1478 PT Time Calculation (min): 29 min   Charges:   PT Evaluation $Initial PT Evaluation Tier I: 1 Procedure PT Treatments $Gait Training: 8-22 mins $Therapeutic Exercise: 8-22 mins   PT G CodesSunny Schlein, [place] [phone] 12/13/2013, 11:17 AM

## 2013-12-13 NOTE — Care Management Note (Signed)
CARE MANAGEMENT NOTE 12/13/2013  Patient:  [name],[name]   Account Number:  [id number]  Date Initiated:  12/13/2013  Documentation initiated by:  Vance PeperBRADY,Larson Limones  Subjective/Objective Assessment:   73 yr old female s/p left total knee arthroplasty.     Action/Plan:   Case manager spoke with patient concerning home health and DME needs at discharge. Choice offered. [facility] Lias[facility].Patient has family support at discharge.   Anticipated DC Date:  12/14/2013   Anticipated DC Plan:  HOME W HOME HEALTH SERVICES      DC Planning Services  CM consult      [facility]  [facility]ALTH  DURABLE MEDICAL EQUIPMENT   Choice offered to / List presented to:  C-1 Patient   DME arranged  3-N-1  WALKER - ROLLING  CPM      DME agency  [employer]     HH arranged  HH-2 PT      HH agency  [facility]   Status of service:  Completed, signed off Medicare Important Message given?  YES (If response is "NO", the following Medicare IM given date fields will be blank) Date Medicare IM given:  12/13/2013 Medicare IM given by:  Vance PeperBRADY,Kaloni Bisaillon Date Additional Medicare IM given:   Additional Medicare IM given by:    Discharge Disposition:  HOME W HOME HEALTH SERVICES  Per UR Regulation:  Reviewed for med. necessity/level of care/duration of stay

## 2013-12-13 NOTE — Progress Notes (Signed)
I participated in the care of this patient and agree with the above history, physical and evaluation. I performed a review of the history and a physical exam as detailed   Chasey Dull Daniel Xiong Haidar MD  

## 2013-12-13 NOTE — Progress Notes (Signed)
Patient ID: [name], female   DOB: [birth year], 73 y.o.   MRN: [id number]     Subjective:  Patient reports pain as mild to moderate.  Patient states that her pain is more intense after this surgery as compared  to last time  Objective:   VITALS:   Filed Vitals:   12/13/13 0000 12/13/13 0230 12/13/13 0400 12/13/13 0526  BP:  120/60  117/57  Pulse:  105  103  Temp:  98.5 F (36.9 C)  98.3 F (36.8 C)  Resp: 18 19 18 17   Height:      Weight:      SpO2: 94% 95% 96% 92%    ABD soft Sensation intact distally Dorsiflexion/Plantar flexion intact Incision: dressing C/D/I and no drainage   Lab Results  Component Value Date   WBC 9.1 12/01/2013   HGB 13.4 12/01/2013   HCT 41.3 12/01/2013   MCV 89.0 12/01/2013   PLT 356 12/01/2013     Assessment/Plan: 1 Day Post-Op   Active Problems:   Arthritis of knee   Advance diet Up with therapy WBAT Dry dressing PRN Plan for DC on Friday   Haskel KhanDOUGLAS Eyden Dobie 12/13/2013, 8:01 AM   Margarita Ranaimothy Murphy MD [phone]

## 2013-12-14 NOTE — Progress Notes (Signed)
I participated in the care of this patient and agree with the above history, physical and evaluation. I performed a review of the history and a physical exam as detailed   Timothy Daniel Murphy MD  

## 2013-12-14 NOTE — Progress Notes (Signed)
Physical Therapy Treatment Patient Details Name: [name] MRN: [id number] DOB: 11/14/1940 Today's Date: 12/14/2013    History of Present Illness pt presents with L TKA.      PT Comments    Pt continues to make great progress.  Will trial stairs this pm.    Follow Up Recommendations  Home health PT;Supervision - Intermittent     Equipment Recommendations  None recommended by PT    Recommendations for Other Services       Precautions / Restrictions Precautions Precautions: Fall Required Braces or Orthoses: Knee Immobilizer - Left Knee Immobilizer - Left: On when out of bed or walking;Discontinue once straight leg raise with < 10 degree lag Restrictions Weight Bearing Restrictions: Yes LLE Weight Bearing: Weight bearing as tolerated    Mobility  Bed Mobility Overal bed mobility: Needs Assistance Bed Mobility: Supine to Sit     Supine to sit: Supervision;HOB elevated     General bed mobility comments: pt needs increased time, but is able to complete without A today.    Transfers Overall transfer level: Needs assistance Equipment used: Rolling walker (2 wheeled) Transfers: Sit to/from Stand Sit to Stand: Min guard         General transfer comment: pt did well coming to stand today, only guarding needed.    Ambulation/Gait Ambulation/Gait assistance: Supervision Ambulation Distance (Feet): 250 Feet Assistive device: Rolling walker (2 wheeled) Gait Pattern/deviations: Step-through pattern;Decreased step length - right;Decreased stance time - left     General Gait Details: pt able to perform more fluid gait pattern and increase amb distance.     Stairs            Wheelchair Mobility    Modified Rankin (Stroke Patients Only)       Balance                                    Cognition Arousal/Alertness: Awake/alert Behavior During Therapy: WFL for tasks assessed/performed Overall Cognitive Status: Within Functional Limits for  tasks assessed                      Exercises Total Joint Exercises Long Arc Quad: AROM;Left;10 reps Knee Flexion: AROM;Left;10 reps Goniometric ROM: ~8 - 75    General Comments        Pertinent Vitals/Pain 5-6/10.  Called for pain meds.      Home Living                      Prior Function            PT Goals (current goals can now be found in the care plan section) Acute Rehab PT Goals Patient Stated Goal: Walk without pain.   PT Goal Formulation: With patient Time For Goal Achievement: 12/20/13 Potential to Achieve Goals: Good Progress towards PT goals: Progressing toward goals    Frequency  7X/week    PT Plan Current plan remains appropriate    Co-evaluation             End of Session Equipment Utilized During Treatment: Gait belt;Left knee immobilizer Activity Tolerance: Patient tolerated treatment well Patient left: in chair;with call bell/phone within reach     Time: 0847-0910 PT Time Calculation (min): 23 min  Charges:  $Gait Training: 8-22 mins $Therapeutic Exercise: 8-22 mins  G CodesSunny Cervantes, [name] [phone] 12/14/2013, 10:31 AM

## 2013-12-14 NOTE — Progress Notes (Signed)
Patient ID: [name], female   DOB: [birth year], 73 y.o.   MRN: [id number]     Subjective:  Patient reports pain as mild.  Patient denies any CP or SOB.  Patient up with PT and ambulating.  Objective:   VITALS:   Filed Vitals:   12/13/13 2008 12/13/13 2314 12/14/13 0400 12/14/13 0639  BP: 110/54   108/50  Pulse: 98   96  Temp: 97.9 F (36.6 C)   97.8 F (36.6 C)  TempSrc:    Oral  Resp: 18 18 17 18   Height:      Weight:      SpO2: 93%   94%    ABD soft Sensation intact distally Dorsiflexion/Plantar flexion intact Incision: dressing C/D/I and no drainage   Lab Results  Component Value Date   WBC 9.1 12/01/2013   HGB 13.4 12/01/2013   HCT 41.3 12/01/2013   MCV 89.0 12/01/2013   PLT 356 12/01/2013     Assessment/Plan: 2 Days Post-Op   Active Problems:   Arthritis of knee   Advance diet Up with therapy Plan for discharge tomorrow Plan for home DC WBAT Dry dressing PRN    Haskel KhanDOUGLAS Beckem Tomberlin 12/14/2013, 7:41 AM   Margarita Ranaimothy Murphy MD [phone]

## 2013-12-14 NOTE — Progress Notes (Signed)
Called Dr. Eulah PontMurphy to report that pt's bp was 90/40 at last taking.  Pt was just waking up, but has been fairly low during this admission.  Lisinopril was held this am for this reason.  Will continue to recheck and monitor.  Pt is asymptomatic at this time.

## 2013-12-14 NOTE — Progress Notes (Signed)
Orthopedic Tech Progress Note Patient Details:  Julia JoDonna Brownlow [birth year] [id number]  Patient ID: [name], female   DOB: [birth year], 73 y.o.   MRN: [id number] Placed pt's lle in cpm @ 0-60 degrees @ 1445  Nikki DomCrawford, Alejandro Adcox 12/14/2013, 2:46 PM

## 2013-12-14 NOTE — Progress Notes (Signed)
Physical Therapy Treatment Patient Details Name: [name] MRN: [id number] DOB: 1940-08-04 Today's Date: 12/14/2013    History of Present Illness pt presents with L TKA.      PT Comments    Pt continues to improve mobility.  Performed stairs this session.  Pt ready for D/c from PT stand point.    Follow Up Recommendations  Home health PT;Supervision - Intermittent     Equipment Recommendations  None recommended by PT    Recommendations for Other Services       Precautions / Restrictions Precautions Precautions: Fall Required Braces or Orthoses: Knee Immobilizer - Left Knee Immobilizer - Left: On when out of bed or walking;Discontinue once straight leg raise with < 10 degree lag Restrictions Weight Bearing Restrictions: Yes LLE Weight Bearing: Weight bearing as tolerated    Mobility  Bed Mobility Overal bed mobility: Needs Assistance Bed Mobility: Supine to Sit;Sit to Supine     Supine to sit: Supervision;HOB elevated Sit to supine: Supervision;HOB elevated   General bed mobility comments: pt needs increased time, but is able to complete without A today.    Transfers Overall transfer level: Needs assistance Equipment used: Rolling walker (2 wheeled) Transfers: Sit to/from Stand Sit to Stand: Min assist         General transfer comment: pt again needing A this pm with coming to stand.    Ambulation/Gait Ambulation/Gait assistance: Supervision Ambulation Distance (Feet): 300 Feet Assistive device: Rolling walker (2 wheeled) Gait Pattern/deviations: Step-through pattern;Decreased step length - right;Decreased stance time - left     General Gait Details: pt able to perform more fluid gait pattern and increase amb distance.     Stairs Stairs: Yes Stairs assistance: Supervision Stair Management: One rail Right;Step to pattern;Forwards Number of Stairs: 5 General stair comments: cues for gait sequencing as pt tends to amb as she did after R TKA.     Wheelchair Mobility    Modified Rankin (Stroke Patients Only)       Balance                                    Cognition Arousal/Alertness: Awake/alert Behavior During Therapy: WFL for tasks assessed/performed Overall Cognitive Status: Within Functional Limits for tasks assessed                      Exercises Total Joint Exercises Long Arc Quad: AROM;Left;10 reps Knee Flexion: AROM;Left;10 reps    General Comments        Pertinent Vitals/Pain 5/10 during ROM.  Premedicated.      Home Living                      Prior Function            PT Goals (current goals can now be found in the care plan section) Acute Rehab PT Goals Patient Stated Goal: Walk without pain.   PT Goal Formulation: With patient Time For Goal Achievement: 12/20/13 Potential to Achieve Goals: Good Progress towards PT goals: Progressing toward goals    Frequency  7X/week    PT Plan Current plan remains appropriate    Co-evaluation             End of Session Equipment Utilized During Treatment: Gait belt;Left knee immobilizer Activity Tolerance: Patient tolerated treatment well Patient left: in bed;with call bell/phone within reach     Time: 4034-7425  PT Time Calculation (min): 18 min  Charges:  $Gait Training: 8-22 mins                    G CodesSunny Schlein:      Talesha Ellithorpe F, [place] [phone] 12/14/2013, 3:00 PM

## 2013-12-15 NOTE — Discharge Summary (Signed)
Physician Discharge Summary  Patient ID: [name] MRN: [id number] DOB/AGE: 71/02/1941 71 y.o.  Admit date: 12/12/2013 Discharge date: 12/15/2013  Admission Diagnoses:  <principal problem not specified>  Discharge Diagnoses:  Active Problems:   Arthritis of knee   Past Medical History  Diagnosis Date  . Hypertension   . Shortness of breath     with exertion  . PONV (postoperative nausea and vomiting)     when I wake up  . Migraine     "one time" (12/12/2013)  . Arthritis     "knees" (12/12/2013)    Surgeries: Procedure(s): LEFT TOTAL KNEE ARTHROPLASTY on 12/12/2013   Consultants (if any):    Discharged Condition: Improved  Hospital Course: [name] is an 73 y.o. female who was admitted 12/12/2013 with a diagnosis of <principal problem not specified> and went to the operating room on 12/12/2013 and underwent the above named procedures.    She was given perioperative antibiotics:  Anti-infectives   Start     Dose/Rate Route Frequency Ordered Stop   12/12/13 1330  ceFAZolin (ANCEF) IVPB 2 g/50 mL premix     2 g 100 mL/hr over 30 Minutes Intravenous Every 6 hours 12/12/13 1312 12/12/13 1931   12/12/13 0600  ceFAZolin (ANCEF) IVPB 2 g/50 mL premix     2 g 100 mL/hr over 30 Minutes Intravenous On call to O.R. 12/11/13 1515 12/12/13 0744    .  She was given sequential compression devices, early ambulation, and ASA 325 for DVT prophylaxis.  She benefited maximally from the hospital stay and there were no complications.    She will hydrate well and call her PCP to discuss BP meds.   Recent vital signs:  Filed Vitals:   12/15/13 0500  BP: 102/49  Pulse: 94  Temp: 97.9 F (36.6 C)  Resp: 20    Recent laboratory studies:  Lab Results  Component Value Date   HGB 13.4 12/01/2013   HGB 11.7* 07/26/2013   HGB 13.6 07/14/2013   Lab Results  Component Value Date   WBC 9.1 12/01/2013   PLT 356 12/01/2013   Lab Results  Component Value Date   INR 1.08 12/01/2013    Lab Results  Component Value Date   NA 139 12/01/2013   K 4.7 12/01/2013   CL 98 12/01/2013   CO2 28 12/01/2013   BUN 16 12/01/2013   CREATININE 0.83 12/01/2013   GLUCOSE 114* 12/01/2013    Discharge Medications:     Medication List    STOP taking these medications       naproxen sodium 220 MG tablet  Commonly known as:  ANAPROX      TAKE these medications       aspirin EC 325 MG tablet  Take 1 tablet (325 mg total) by mouth daily.     celecoxib 200 MG capsule  Commonly known as:  CELEBREX  Take 1 capsule (200 mg total) by mouth 2 (two) times daily.     docusate sodium 100 MG capsule  Commonly known as:  COLACE  Take 1 capsule (100 mg total) by mouth 2 (two) times daily. Continue this while taking narcotics to help with bowel movements     ibuprofen 200 MG tablet  Commonly known as:  ADVIL,MOTRIN  Take 400 mg by mouth every 6 (six) hours as needed for moderate pain.     lisinopril 10 MG tablet  Commonly known as:  PRINIVIL,ZESTRIL  Take 10 mg by mouth daily.  multivitamin with minerals Tabs tablet  Take 1 tablet by mouth daily.     ondansetron 4 MG tablet  Commonly known as:  ZOFRAN  Take 1 tablet (4 mg total) by mouth every 8 (eight) hours as needed for nausea.     oxyCODONE 5 MG immediate release tablet  Commonly known as:  ROXICODONE  Take 2 tablets (10 mg total) by mouth every 4 (four) hours as needed for severe pain.        Diagnostic Studies: Dg Knee 1-2 Views Left  2013-12-20   CLINICAL DATA:  Status post knee replacement.  EXAM: LEFT KNEE - 1-2 VIEW  COMPARISON:  No priors.  FINDINGS: Two views of the left knee demonstrates postoperative changes of total knee arthroplasty. The femoral and tibial components of the prosthesis appear properly seated without definite periprosthetic fracture or other immediate complicating features. There is some gas in the joint space and the overlying soft tissues which is within normal limits in this immediate  postoperative patient.  IMPRESSION: 1. Expected postoperative changes related to left total knee arthroplasty, as above.   Electronically Signed   By: Trudie Reed M.D.   On: 20-Dec-2013 15:10    Disposition: 06-Home-Health Care Svc      Discharge Instructions   Weight bearing as tolerated    Complete by:  As directed            Follow-up Information   Follow up with Margarita Rana, D, MD. (as scheduled)    Specialty:  Orthopedic Surgery   Contact information:   [address] [place] [address] [phone]       Follow up with [facility]. (Someone from [facility] will contact you concerning start date and time for physical therapy.)    Contact information:   [address] [place] [address] [phone]       Follow up with Sheral Apley, MD. (as scheduled)    Specialty:  Orthopedic Surgery   Contact information:   [address] [place] [address] [phone]        Signed: Margarita Rana, D 12/15/2013, 8:03 AM

## 2013-12-15 NOTE — Progress Notes (Signed)
I participated in the care of this patient and agree with the above history, physical and evaluation. I performed a review of the history and a physical exam as detailed   Destin Vinsant Daniel Zarea Diesing MD  

## 2013-12-15 NOTE — Progress Notes (Signed)
Patient ID: [name], female   DOB: 1941-02-26, 73 y.o.   MRN: [id number]     Subjective:  Patient reports pain as mild.  Patient states that she is ready to go home and has been able to be up and around with PT.  Patient reports that she has not had any issues to this point with the BP being Low and has been asymptomatic today   Objective:   VITALS:   Filed Vitals:   12/14/13 2057 12/15/13 0000 12/15/13 0400 12/15/13 0500  BP: 96/55   102/49  Pulse: 95   94  Temp: 98.4 F (36.9 C)   97.9 F (36.6 C)  TempSrc: Oral   Oral  Resp: 20 20 18 20   Height:      Weight:      SpO2: 94% 94% 94% 100%    ABD soft Sensation intact distally Dorsiflexion/Plantar flexion intact Incision: dressing C/D/I and no drainage   Lab Results  Component Value Date   WBC 9.1 12/01/2013   HGB 13.4 12/01/2013   HCT 41.3 12/01/2013   MCV 89.0 12/01/2013   PLT 356 12/01/2013     Assessment/Plan: 3 Days Post-Op   Active Problems:   Arthritis of knee   Advance diet Up with therapy Discharge home with home health   Haskel KhanDOUGLAS Denecia Brunette 12/15/2013, 7:06 AM   Margarita Ranaimothy Murphy MD [phone]

## 2013-12-15 NOTE — Progress Notes (Signed)
Physical Therapy Treatment Patient Details Name: [name] MRN: [id number] DOB: [birth year] Today's Date: 12/15/2013    History of Present Illness pt presents with L TKA.      PT Comments    Pt moving great.  Pt indicates no further questions and is ready for D/C from PT stand point.    Follow Up Recommendations  Home health PT;Supervision - Intermittent     Equipment Recommendations  None recommended by PT    Recommendations for Other Services       Precautions / Restrictions Precautions Precautions: Fall Required Braces or Orthoses: Knee Immobilizer - Left Knee Immobilizer - Left: On when out of bed or walking;Discontinue once straight leg raise with < 10 degree lag Restrictions Weight Bearing Restrictions: Yes LLE Weight Bearing: Weight bearing as tolerated    Mobility  Bed Mobility                  Transfers Overall transfer level: Needs assistance Equipment used: Rolling walker (2 wheeled) Transfers: Sit to/from Stand Sit to Stand: Min guard            Ambulation/Gait Ambulation/Gait assistance: Supervision Ambulation Distance (Feet): 250 Feet Assistive device: Rolling walker (2 wheeled) Gait Pattern/deviations: Step-through pattern;Decreased step length - right;Decreased stance time - left     General Gait Details: pt continues to improve mobility, though notes increased pain today.     Stairs         General stair comments: pt declined need to try stairs again.    Wheelchair Mobility    Modified Rankin (Stroke Patients Only)       Balance                                    Cognition Arousal/Alertness: Awake/alert Behavior During Therapy: WFL for tasks assessed/performed Overall Cognitive Status: Within Functional Limits for tasks assessed                      Exercises      General Comments        Pertinent Vitals/Pain 5/10 during mobility.  Notified RN for pain meds.      Home Living                       Prior Function            PT Goals (current goals can now be found in the care plan section) Acute Rehab PT Goals Patient Stated Goal: Walk without pain.   PT Goal Formulation: With patient Time For Goal Achievement: 12/20/13 Potential to Achieve Goals: Good Progress towards PT goals: Progressing toward goals    Frequency  7X/week    PT Plan Current plan remains appropriate    Co-evaluation             End of Session Equipment Utilized During Treatment: Gait belt;Left knee immobilizer Activity Tolerance: Patient tolerated treatment well Patient left: in chair;with call bell/phone within reach     Time: [id number] PT Time Calculation (min): 13 min  Charges:  $Gait Training: 8-22 mins                    G CodesSunny Cervantes:      [name] F, [place]PT [phone] 12/15/2013, 10:03 AM

## 2015-07-16 IMAGING — CR DG CHEST 2V
2 series · 2 of 2 positions shown · non-contrast
Comparison: None

CLINICAL DATA: Preoperative evaluation for right total knee
arthroplasty, hypertension

EXAM:
CHEST  2 VIEW

[w chest pa]
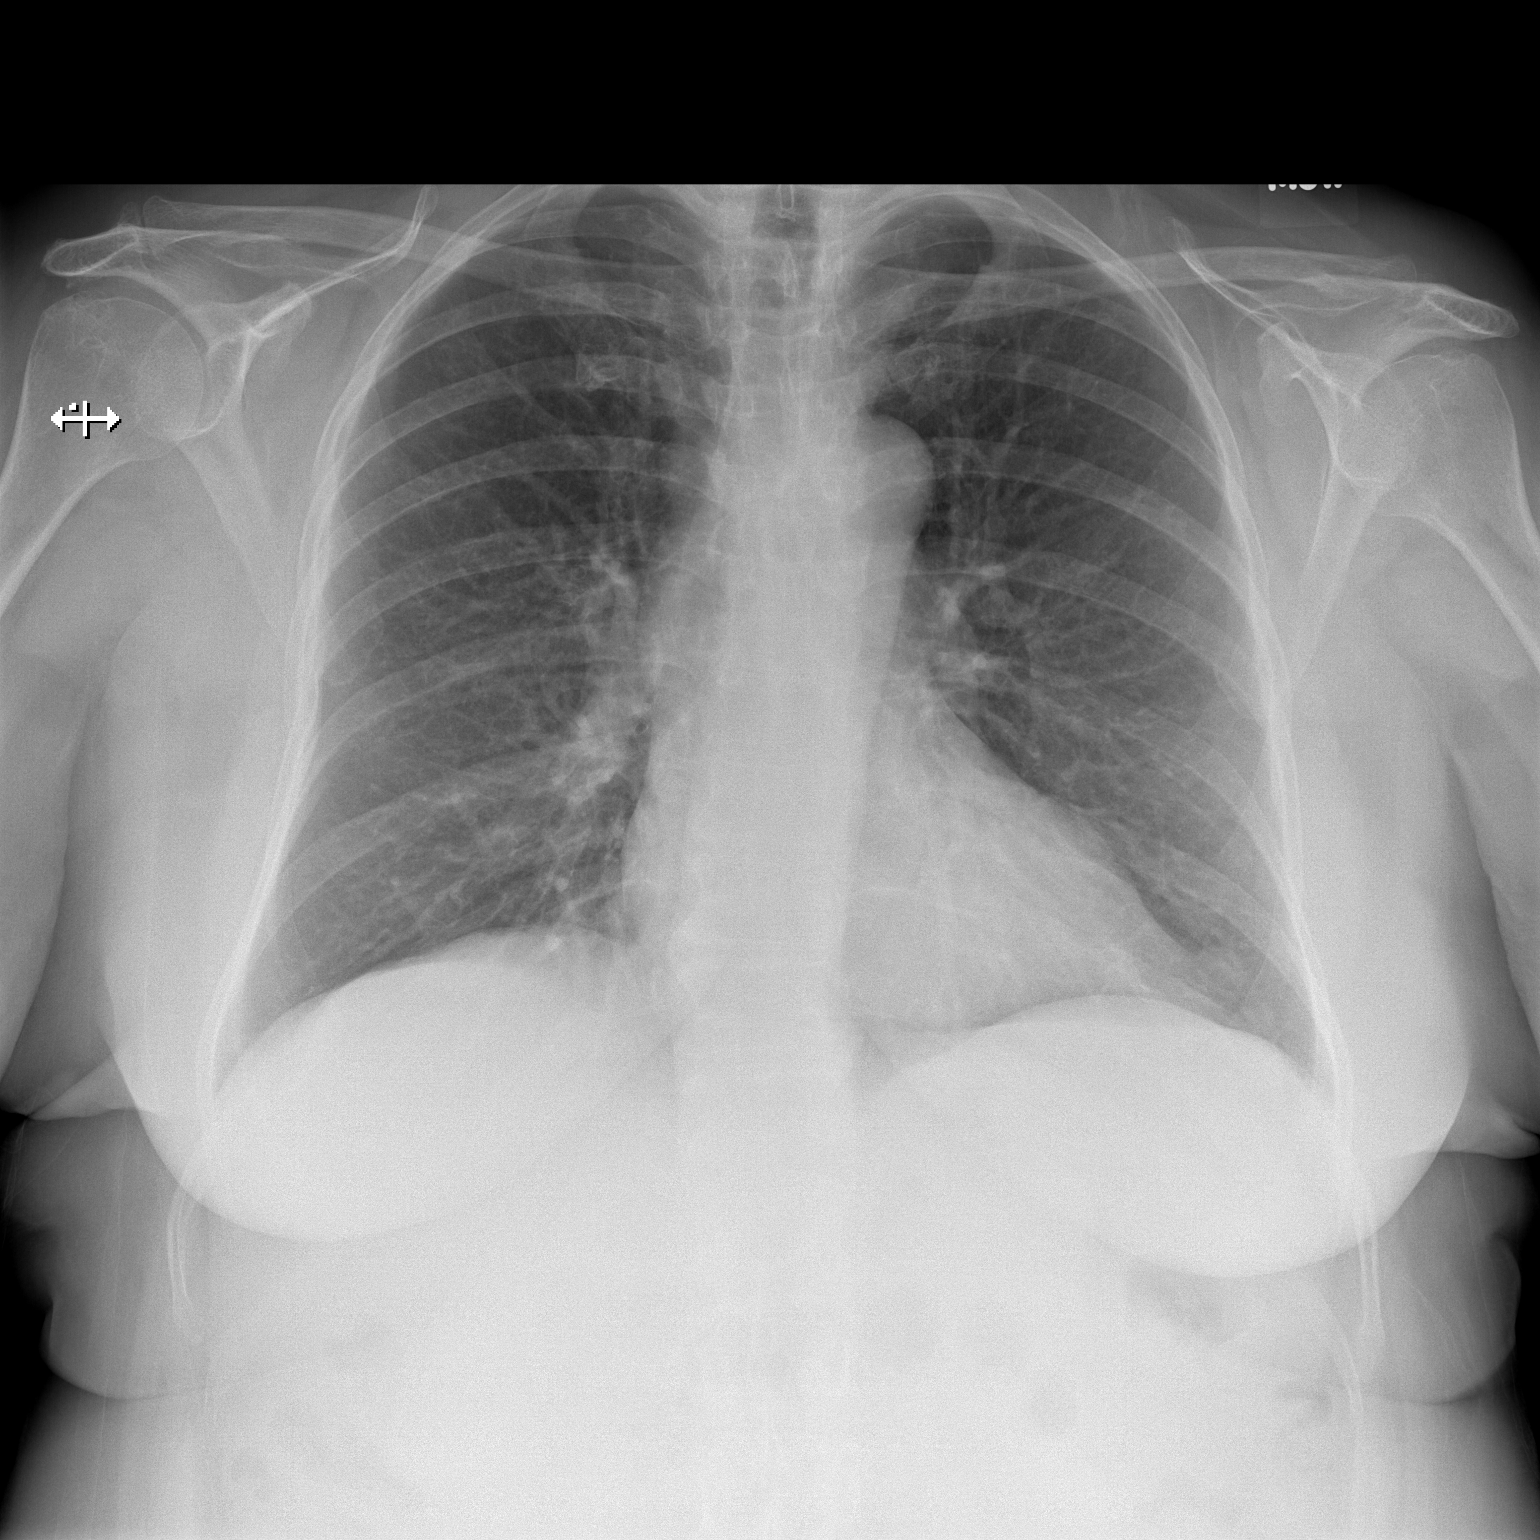

[w chest lat]
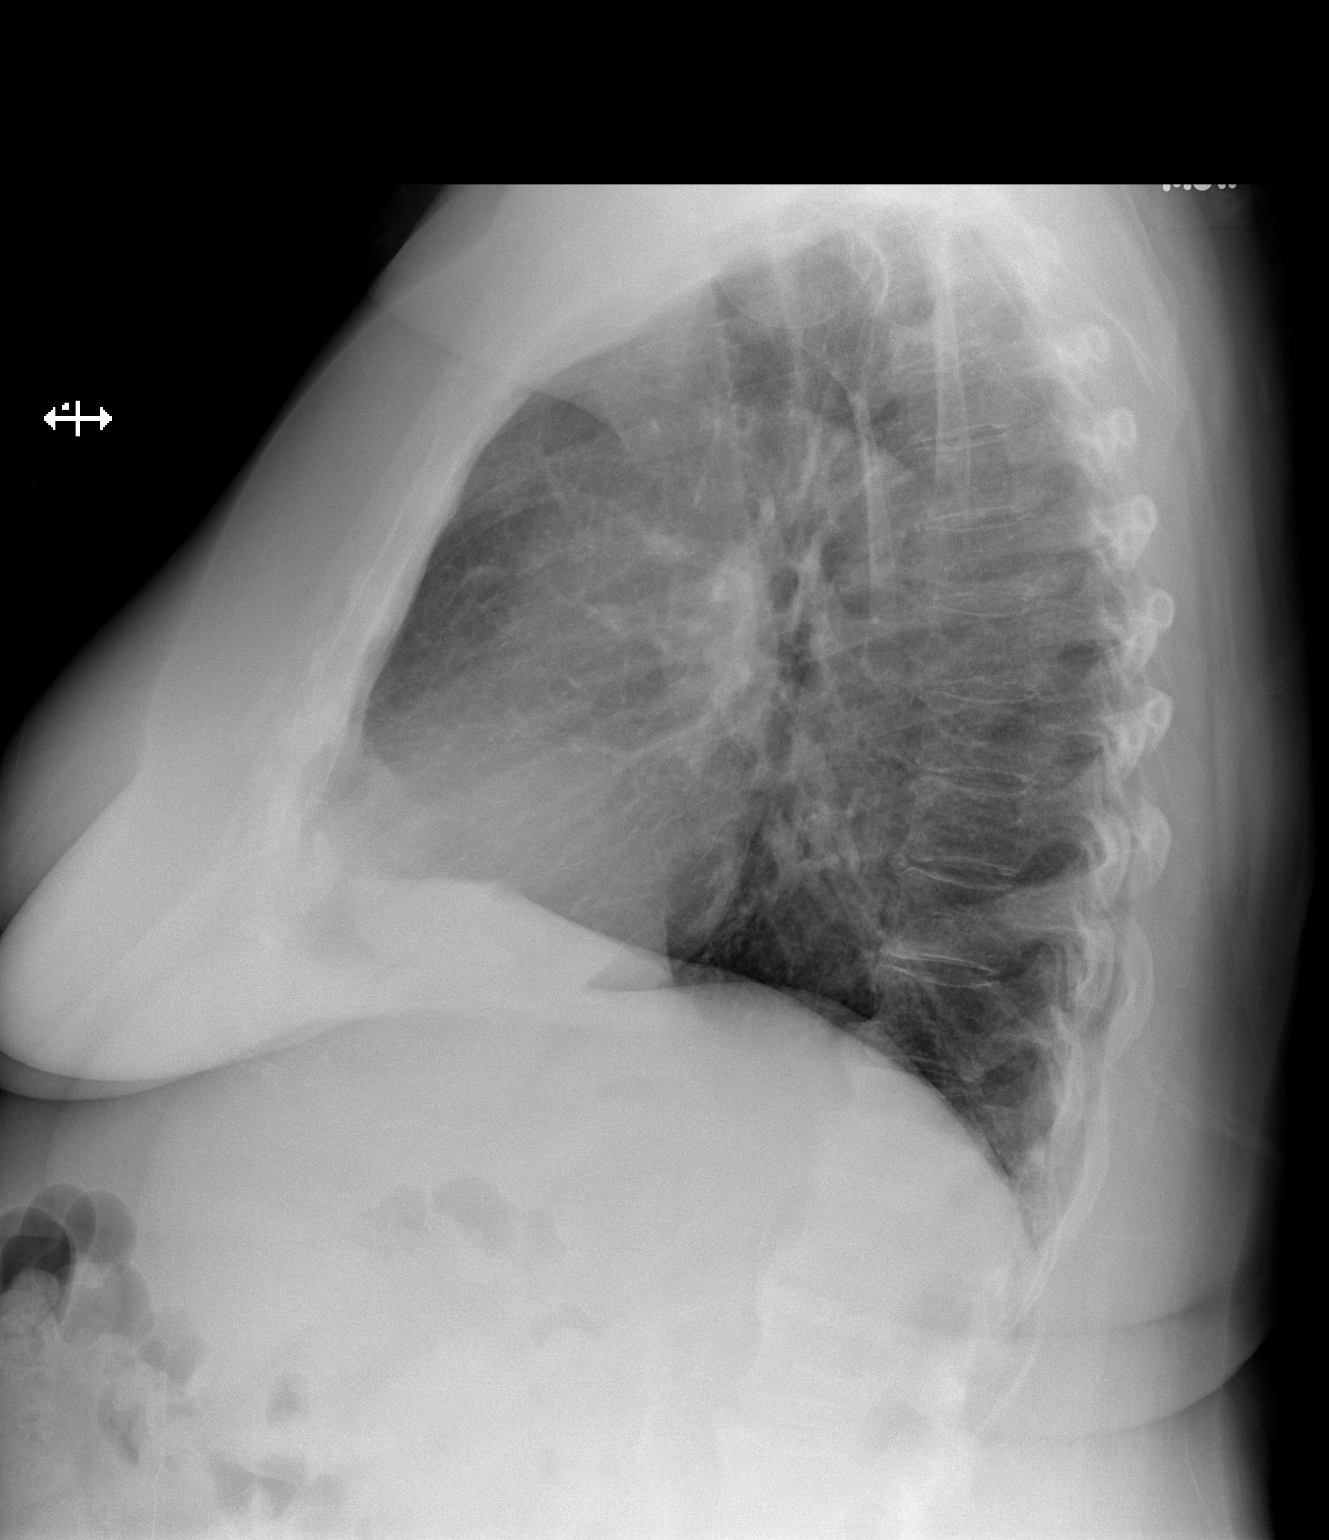

[2 of 2 positions shown; findings below may reference images not displayed]

FINDINGS: Normal heart size, mediastinal contours, and pulmonary vascularity.

Lungs clear.

No pleural effusion or pneumothorax.

Bones unremarkable.
IMPRESSION: Normal exam.

## 2015-07-28 IMAGING — CR DG KNEE 1-2V*R*
2 series · 2 of 2 positions shown · non-contrast
Comparison: None.

CLINICAL DATA: 72-year-old female status post surgery. Initial
encounter.

EXAM:
RIGHT KNEE - 1-2 VIEW

[AP]
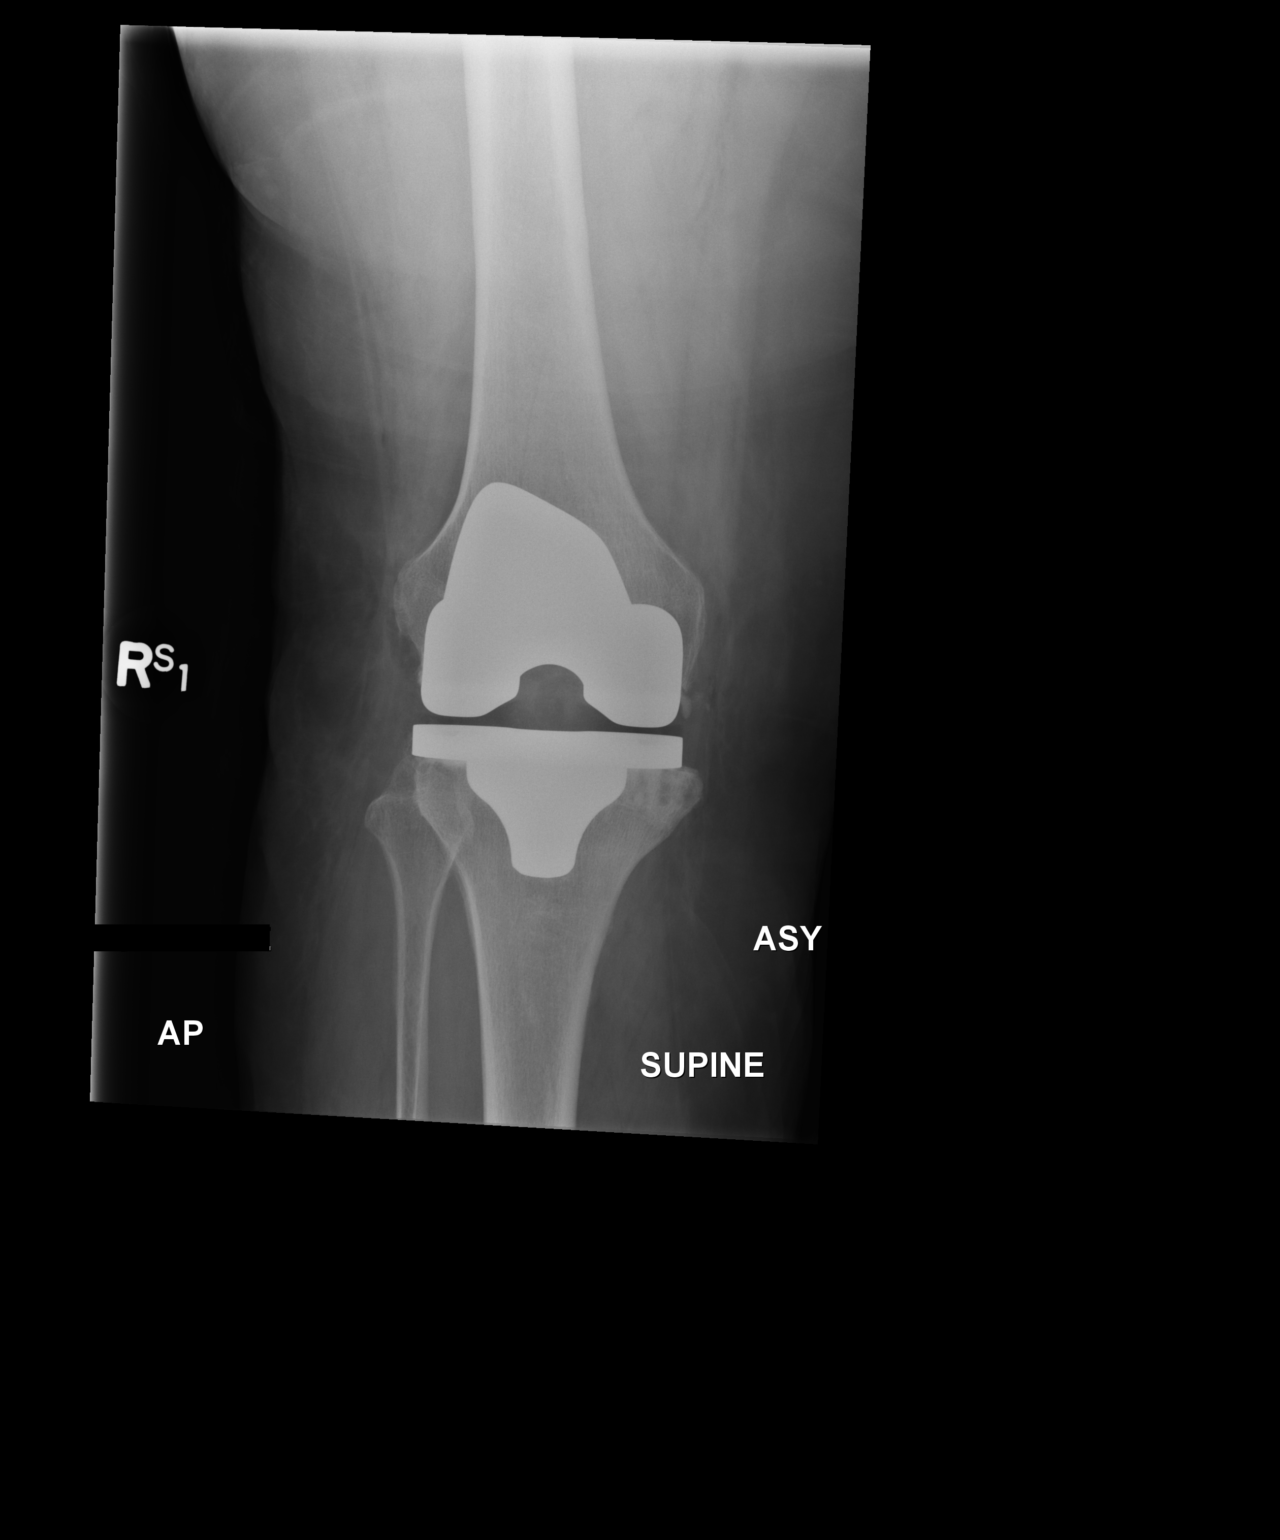

[xtable lateral]
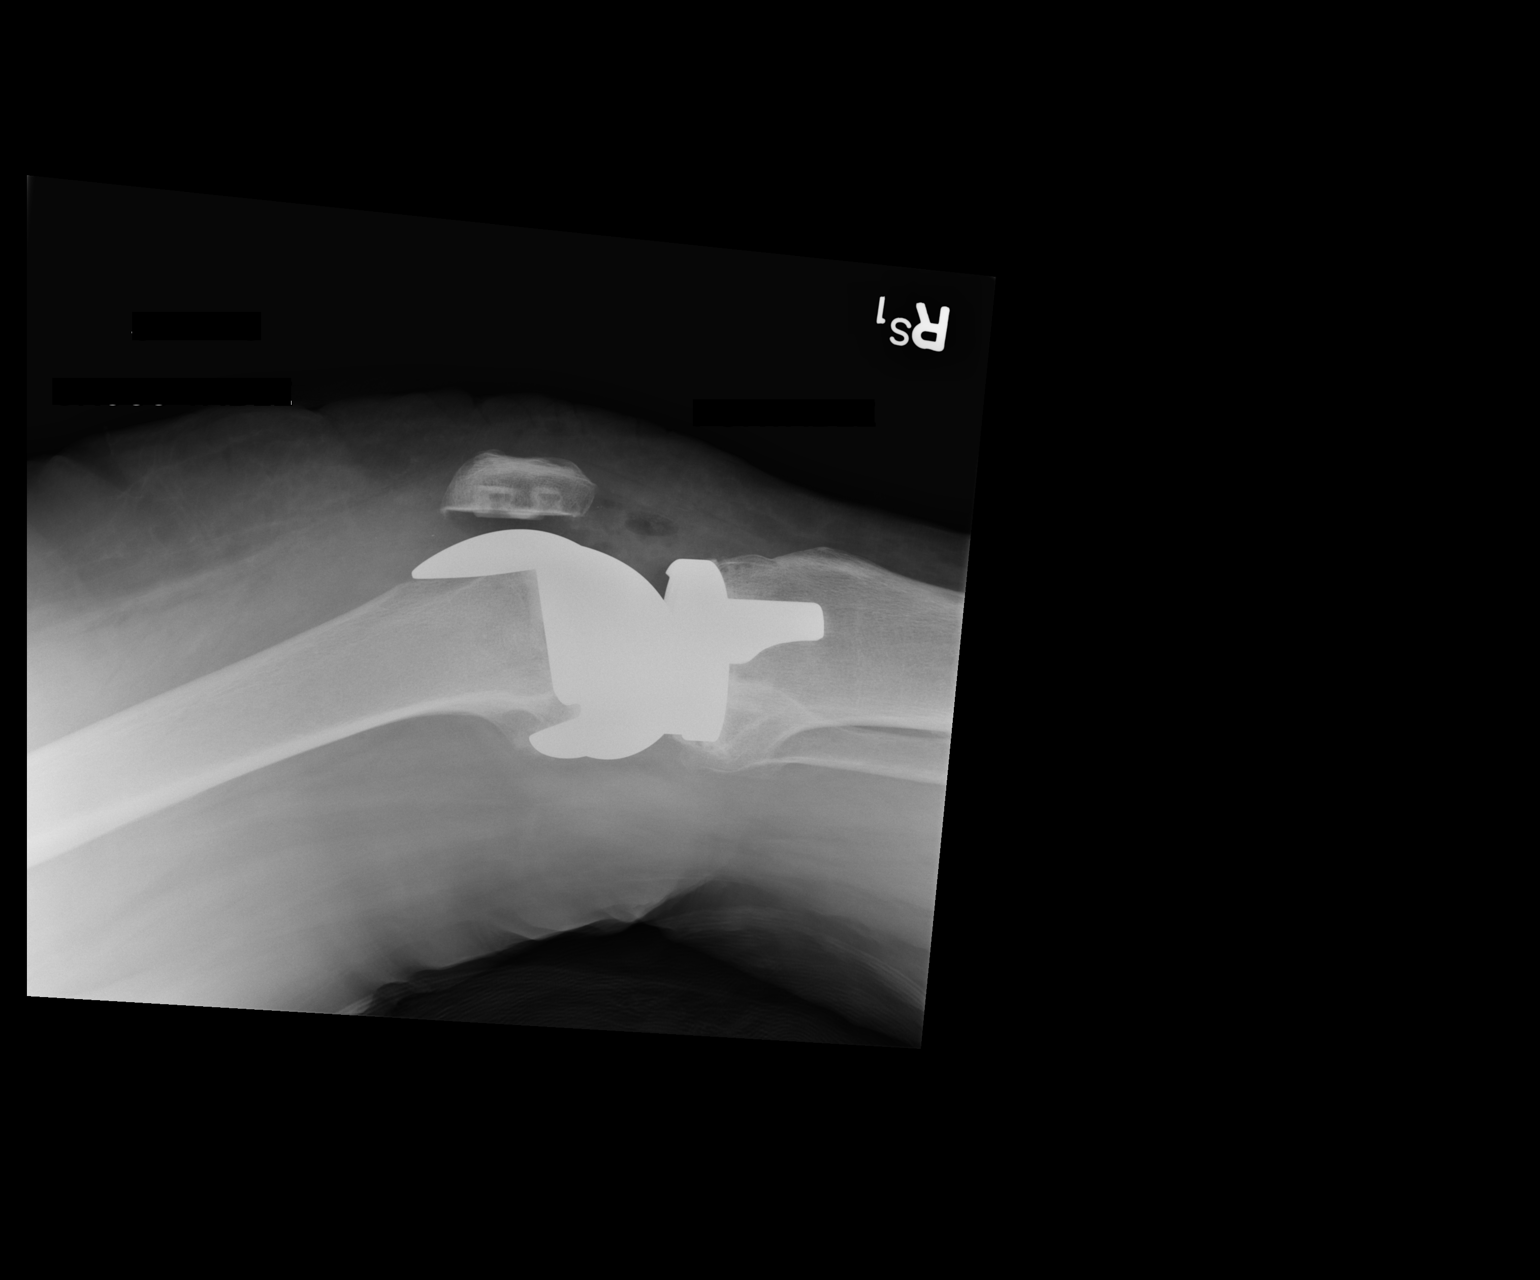

[2 of 2 positions shown; findings below may reference images not displayed]

FINDINGS: Portable AP and cross-table lateral views of the right knee
demonstrating sequelae of total knee arthroplasty. Hardware appears
intact and normally aligned. Postoperative changes to the patella.
Postoperative changes to the surrounding soft tissues. No unexpected
osseous finding identified.
IMPRESSION: Right total knee arthroplasty with no adverse features identified.
